# Patient Record
Sex: Male | Born: 1945 | Race: White | Hispanic: No | Marital: Married | Smoking: Light tobacco smoker
Health system: Southern US, Community
[De-identification: ages and names within clinical notes are randomized; demographics above are authoritative.]

## PROBLEM LIST (undated history)

## (undated) DIAGNOSIS — K219 Gastro-esophageal reflux disease without esophagitis: Secondary | ICD-10-CM

## (undated) DIAGNOSIS — I639 Cerebral infarction, unspecified: Secondary | ICD-10-CM

## (undated) DIAGNOSIS — C801 Malignant (primary) neoplasm, unspecified: Secondary | ICD-10-CM

## (undated) DIAGNOSIS — J45909 Unspecified asthma, uncomplicated: Secondary | ICD-10-CM

## (undated) DIAGNOSIS — R42 Dizziness and giddiness: Secondary | ICD-10-CM

## (undated) DIAGNOSIS — H9190 Unspecified hearing loss, unspecified ear: Secondary | ICD-10-CM

## (undated) HISTORY — PX: JOINT REPLACEMENT: SHX530

## (undated) HISTORY — PX: EYE SURGERY: SHX253

## (undated) HISTORY — PX: LACERATION REPAIR: SHX5168

---

## 1998-04-04 ENCOUNTER — Other Ambulatory Visit: Admission: RE | Admit: 1998-04-04 | Discharge: 1998-04-04 | Payer: Self-pay

## 1999-03-06 ENCOUNTER — Ambulatory Visit (HOSPITAL_COMMUNITY): Admission: RE | Admit: 1999-03-06 | Discharge: 1999-03-06 | Payer: Self-pay | Admitting: Gastroenterology

## 2004-06-23 ENCOUNTER — Ambulatory Visit (HOSPITAL_COMMUNITY): Admission: RE | Admit: 2004-06-23 | Discharge: 2004-06-23 | Payer: Self-pay | Admitting: *Deleted

## 2014-07-29 ENCOUNTER — Ambulatory Visit: Payer: Self-pay | Admitting: Gastroenterology

## 2014-07-31 LAB — PATHOLOGY REPORT

## 2016-07-05 DIAGNOSIS — M25561 Pain in right knee: Secondary | ICD-10-CM | POA: Diagnosis not present

## 2016-07-05 DIAGNOSIS — M1711 Unilateral primary osteoarthritis, right knee: Secondary | ICD-10-CM | POA: Diagnosis not present

## 2016-07-23 DIAGNOSIS — M1711 Unilateral primary osteoarthritis, right knee: Secondary | ICD-10-CM | POA: Diagnosis not present

## 2016-08-04 ENCOUNTER — Ambulatory Visit
Admission: RE | Admit: 2016-08-04 | Discharge: 2016-08-04 | Disposition: A | Discharge status: Home / Self Care | Payer: PPO | Source: Ambulatory Visit | Attending: Orthopedic Surgery | Admitting: Orthopedic Surgery

## 2016-08-04 DIAGNOSIS — Z01810 Encounter for preprocedural cardiovascular examination: Secondary | ICD-10-CM | POA: Insufficient documentation

## 2016-08-04 DIAGNOSIS — Z01812 Encounter for preprocedural laboratory examination: Secondary | ICD-10-CM | POA: Diagnosis not present

## 2016-08-04 HISTORY — DX: Unspecified asthma, uncomplicated: J45.909

## 2016-08-04 HISTORY — DX: Gastro-esophageal reflux disease without esophagitis: K21.9

## 2016-08-04 LAB — COMPREHENSIVE METABOLIC PANEL
ALT: 19 U/L (ref 17–63)
ANION GAP: 7 (ref 5–15)
AST: 22 U/L (ref 15–41)
Albumin: 4.8 g/dL (ref 3.5–5.0)
Alkaline Phosphatase: 56 U/L (ref 38–126)
BUN: 13 mg/dL (ref 6–20)
CHLORIDE: 104 mmol/L (ref 101–111)
CO2: 25 mmol/L (ref 22–32)
Calcium: 9.2 mg/dL (ref 8.9–10.3)
Creatinine, Ser: 0.89 mg/dL (ref 0.61–1.24)
Glucose, Bld: 131 mg/dL — ABNORMAL HIGH (ref 65–99)
POTASSIUM: 3.9 mmol/L (ref 3.5–5.1)
Sodium: 136 mmol/L (ref 135–145)
TOTAL PROTEIN: 7.8 g/dL (ref 6.5–8.1)
Total Bilirubin: 0.4 mg/dL (ref 0.3–1.2)

## 2016-08-04 LAB — SURGICAL PCR SCREEN
MRSA, PCR: NEGATIVE
Staphylococcus aureus: NEGATIVE

## 2016-08-04 LAB — CBC
HCT: 42.9 % (ref 40.0–52.0)
Hemoglobin: 15 g/dL (ref 13.0–18.0)
MCH: 30.3 pg (ref 26.0–34.0)
MCHC: 34.8 g/dL (ref 32.0–36.0)
MCV: 87.1 fL (ref 80.0–100.0)
PLATELETS: 226 10*3/uL (ref 150–440)
RBC: 4.93 MIL/uL (ref 4.40–5.90)
RDW: 13.7 % (ref 11.5–14.5)
WBC: 8.3 10*3/uL (ref 3.8–10.6)

## 2016-08-04 LAB — TYPE AND SCREEN
ABO/RH(D): B POS
ANTIBODY SCREEN: NEGATIVE

## 2016-08-04 LAB — URINALYSIS COMPLETE WITH MICROSCOPIC (ARMC ONLY)
BACTERIA UA: NONE SEEN
Bilirubin Urine: NEGATIVE
GLUCOSE, UA: NEGATIVE mg/dL
HGB URINE DIPSTICK: NEGATIVE
Leukocytes, UA: NEGATIVE
Nitrite: NEGATIVE
PROTEIN: NEGATIVE mg/dL
Specific Gravity, Urine: 1.014 (ref 1.005–1.030)
pH: 6 (ref 5.0–8.0)

## 2016-08-04 LAB — PROTIME-INR
INR: 1.02
PROTHROMBIN TIME: 13.4 s (ref 11.4–15.2)

## 2016-08-04 LAB — SEDIMENTATION RATE: SED RATE: 8 mm/h (ref 0–20)

## 2016-08-04 LAB — APTT: aPTT: 36 seconds (ref 24–36)

## 2016-08-04 NOTE — Patient Instructions (Signed)
  Your procedure is scheduled on: 08/18/16 Wed Report to Same Day Surgery 2nd floor medical mall To find out your arrival time please call [phone] between 1PM - 3PM on 08/17/16 Tues  Remember: Instructions that are not followed completely may result in serious medical risk, up to and including death, or upon the discretion of your surgeon and anesthesiologist your surgery may need to be rescheduled.    _x___ 1. Do not eat food or drink liquids after midnight. No gum chewing or hard candies.     ____ 2. No Alcohol for 24 hours before or after surgery.   ____3. No Smoking for 24 prior to surgery.   ____  4. Bring all medications with you on the day of surgery if instructed.    __x__ 5. Notify your doctor if there is any change in your medical condition     (cold, fever, infections).     Do not wear jewelry, make-up, hairpins, clips or nail polish.  Do not wear lotions, powders, or perfumes. You may wear deodorant.  Do not shave 48 hours prior to surgery. Men may shave face and neck.  Do not bring valuables to the hospital.    [facility] is not responsible for any belongings or valuables.               Contacts, dentures or bridgework may not be worn into surgery.  Leave your suitcase in the car. After surgery it may be brought to your room.  For patients admitted to the hospital, discharge time is determined by your treatment team.   Patients discharged the day of surgery will not be allowed to drive home.    Please read over the following fact sheets that you were given:   [facility] Preparing for Surgery and or MRSA Information   _x___ Take these medicines the morning of surgery with A SIP OF WATER:    1. None  2.  3.  4.  5.  6.  ____ Fleet Enema (as directed)   _x___ Use CHG Soap or sage wipes as directed on instruction sheet   ____ Use inhalers on the day of surgery and bring to hospital day of surgery  ____ Stop metformin 2 days prior to surgery    ____  Take 1/2 of usual insulin dose the night before surgery and none on the morning of           surgery.   _x___ Stop aspirin or coumadin, or plavix stop aspirin 1 week before surgery  _x__ Stop Anti-inflammatories such as Advil, Aleve, Ibuprofen, Motrin, Naproxen,          Naprosyn, Goodies powders or aspirin products. Ok to take Tylenol.   _x___ Stop supplements until after surgery.  Stop fish oil 1 week before surgery  ____ Bring C-Pap to the hospital.

## 2016-08-05 LAB — URINE CULTURE
Culture: NO GROWTH
SPECIAL REQUESTS: NORMAL

## 2016-08-18 ENCOUNTER — Inpatient Hospital Stay
Admission: RE | Admit: 2016-08-18 | Discharge: 2016-08-20 | DRG: 470 | Disposition: A | Discharge status: Home Health | Payer: PPO | Source: Ambulatory Visit | Attending: Orthopedic Surgery | Admitting: Orthopedic Surgery

## 2016-08-18 ENCOUNTER — Inpatient Hospital Stay: Payer: PPO | Admitting: Anesthesiology

## 2016-08-18 ENCOUNTER — Inpatient Hospital Stay: Payer: PPO

## 2016-08-18 ENCOUNTER — Encounter
Admission: RE | Disposition: A | Discharge status: Home Health | Payer: Self-pay | Source: Ambulatory Visit | Attending: Orthopedic Surgery

## 2016-08-18 ENCOUNTER — Encounter: Payer: Self-pay | Admitting: Orthopedic Surgery

## 2016-08-18 DIAGNOSIS — Z87891 Personal history of nicotine dependence: Secondary | ICD-10-CM

## 2016-08-18 DIAGNOSIS — M6281 Muscle weakness (generalized): Secondary | ICD-10-CM

## 2016-08-18 DIAGNOSIS — Z79899 Other long term (current) drug therapy: Secondary | ICD-10-CM

## 2016-08-18 DIAGNOSIS — Z96651 Presence of right artificial knee joint: Secondary | ICD-10-CM | POA: Diagnosis not present

## 2016-08-18 DIAGNOSIS — M1711 Unilateral primary osteoarthritis, right knee: Principal | ICD-10-CM | POA: Diagnosis present

## 2016-08-18 DIAGNOSIS — K219 Gastro-esophageal reflux disease without esophagitis: Secondary | ICD-10-CM | POA: Diagnosis not present

## 2016-08-18 DIAGNOSIS — Z471 Aftercare following joint replacement surgery: Secondary | ICD-10-CM | POA: Diagnosis not present

## 2016-08-18 DIAGNOSIS — M25561 Pain in right knee: Secondary | ICD-10-CM

## 2016-08-18 DIAGNOSIS — R262 Difficulty in walking, not elsewhere classified: Secondary | ICD-10-CM

## 2016-08-18 DIAGNOSIS — Z96659 Presence of unspecified artificial knee joint: Secondary | ICD-10-CM

## 2016-08-18 DIAGNOSIS — J45909 Unspecified asthma, uncomplicated: Secondary | ICD-10-CM | POA: Diagnosis present

## 2016-08-18 HISTORY — PX: KNEE ARTHROPLASTY: SHX992

## 2016-08-18 LAB — ABO/RH: ABO/RH(D): B POS

## 2016-08-18 SURGERY — ARTHROPLASTY, KNEE, TOTAL, USING IMAGELESS COMPUTER-ASSISTED NAVIGATION
Anesthesia: Spinal | Site: Knee | Laterality: Right | Wound class: Clean

## 2016-08-18 MED ORDER — TRANEXAMIC ACID 1000 MG/10ML IV SOLN
1000.0000 mg | Freq: Once | INTRAVENOUS | Status: AC
  Administered 2016-08-18: 1000 mg via INTRAVENOUS
  Filled 2016-08-18: qty 10

## 2016-08-18 MED ORDER — SODIUM CHLORIDE 0.9 % IV SOLN
INTRAVENOUS | Status: DC | PRN
  Administered 2016-08-18: 60 mL

## 2016-08-18 MED ORDER — ACETAMINOPHEN 10 MG/ML IV SOLN
INTRAVENOUS | Status: AC
  Filled 2016-08-18: qty 100

## 2016-08-18 MED ORDER — MENTHOL 3 MG MT LOZG
1.0000 | LOZENGE | OROMUCOSAL | Status: DC | PRN
  Filled 2016-08-18: qty 9

## 2016-08-18 MED ORDER — NEOMYCIN-POLYMYXIN B GU 40-200000 IR SOLN
Status: DC | PRN
  Administered 2016-08-18: 14 mL

## 2016-08-18 MED ORDER — ALUM & MAG HYDROXIDE-SIMETH 200-200-20 MG/5ML PO SUSP: 30.0000 mL | ORAL | Status: DC | PRN

## 2016-08-18 MED ORDER — CALCIUM CARBONATE ANTACID 500 MG PO CHEW
500.0000 mg | CHEWABLE_TABLET | Freq: Every day | ORAL | Status: DC
  Administered 2016-08-19 – 2016-08-20 (×2): 500 mg via ORAL
  Filled 2016-08-18 (×2): qty 1

## 2016-08-18 MED ORDER — OMEGA-3-ACID ETHYL ESTERS 1 G PO CAPS
1.0000 g | ORAL_CAPSULE | Freq: Every day | ORAL | Status: DC
  Administered 2016-08-18 – 2016-08-20 (×3): 1 g via ORAL
  Filled 2016-08-18 (×3): qty 1

## 2016-08-18 MED ORDER — BUPIVACAINE HCL (PF) 0.5 % IJ SOLN
INTRAMUSCULAR | Status: DC | PRN
  Administered 2016-08-18: 3 mL

## 2016-08-18 MED ORDER — SALINE SPRAY 0.65 % NA SOLN
1.0000 | NASAL | Status: DC | PRN
  Filled 2016-08-18: qty 44

## 2016-08-18 MED ORDER — CEFAZOLIN SODIUM-DEXTROSE 2-4 GM/100ML-% IV SOLN
2.0000 g | Freq: Four times a day (QID) | INTRAVENOUS | Status: AC
  Administered 2016-08-18 – 2016-08-19 (×4): 2 g via INTRAVENOUS
  Filled 2016-08-18 (×5): qty 100

## 2016-08-18 MED ORDER — METOCLOPRAMIDE HCL 10 MG PO TABS
10.0000 mg | ORAL_TABLET | Freq: Three times a day (TID) | ORAL | Status: DC
  Administered 2016-08-18 – 2016-08-20 (×7): 10 mg via ORAL
  Filled 2016-08-18 (×7): qty 1

## 2016-08-18 MED ORDER — FENTANYL CITRATE (PF) 100 MCG/2ML IJ SOLN
INTRAMUSCULAR | Status: AC
  Filled 2016-08-18: qty 2

## 2016-08-18 MED ORDER — CALCIUM CARBONATE ANTACID 500 MG PO CHEW: 1.0000 | CHEWABLE_TABLET | Freq: Two times a day (BID) | ORAL | Status: DC | PRN

## 2016-08-18 MED ORDER — DIPHENHYDRAMINE HCL 12.5 MG/5ML PO ELIX: 12.5000 mg | ORAL_SOLUTION | ORAL | Status: DC | PRN

## 2016-08-18 MED ORDER — BISACODYL 10 MG RE SUPP
10.0000 mg | Freq: Every day | RECTAL | Status: DC | PRN
  Filled 2016-08-18: qty 1

## 2016-08-18 MED ORDER — FLEET ENEMA 7-19 GM/118ML RE ENEM: 1.0000 | ENEMA | Freq: Once | RECTAL | Status: DC | PRN

## 2016-08-18 MED ORDER — ONDANSETRON HCL 4 MG/2ML IJ SOLN: 4.0000 mg | Freq: Once | INTRAMUSCULAR | Status: DC | PRN

## 2016-08-18 MED ORDER — ACETAMINOPHEN 10 MG/ML IV SOLN
1000.0000 mg | Freq: Four times a day (QID) | INTRAVENOUS | Status: AC
  Administered 2016-08-18 – 2016-08-19 (×4): 1000 mg via INTRAVENOUS
  Filled 2016-08-18 (×4): qty 100

## 2016-08-18 MED ORDER — FENTANYL CITRATE (PF) 100 MCG/2ML IJ SOLN
25.0000 ug | INTRAMUSCULAR | Status: DC | PRN
  Administered 2016-08-18 (×4): 25 ug via INTRAVENOUS

## 2016-08-18 MED ORDER — ACETAMINOPHEN 325 MG PO TABS: 650.0000 mg | ORAL_TABLET | Freq: Four times a day (QID) | ORAL | Status: DC | PRN

## 2016-08-18 MED ORDER — SODIUM CHLORIDE FLUSH 0.9 % IV SOLN
INTRAVENOUS | Status: AC
  Filled 2016-08-18: qty 10

## 2016-08-18 MED ORDER — CEFAZOLIN SODIUM-DEXTROSE 2-4 GM/100ML-% IV SOLN: 2.0000 g | INTRAVENOUS | Status: DC

## 2016-08-18 MED ORDER — CELECOXIB 200 MG PO CAPS
200.0000 mg | ORAL_CAPSULE | Freq: Two times a day (BID) | ORAL | Status: DC
  Administered 2016-08-18 – 2016-08-20 (×4): 200 mg via ORAL
  Filled 2016-08-18 (×4): qty 1

## 2016-08-18 MED ORDER — FERROUS SULFATE 325 (65 FE) MG PO TABS
325.0000 mg | ORAL_TABLET | Freq: Two times a day (BID) | ORAL | Status: DC
  Administered 2016-08-18 – 2016-08-20 (×4): 325 mg via ORAL
  Filled 2016-08-18 (×4): qty 1

## 2016-08-18 MED ORDER — MAGNESIUM HYDROXIDE 400 MG/5ML PO SUSP
30.0000 mL | Freq: Every day | ORAL | Status: DC | PRN
  Filled 2016-08-18: qty 30

## 2016-08-18 MED ORDER — SODIUM CHLORIDE 0.9 % IV SOLN
Freq: Once | INTRAVENOUS | Status: AC
  Administered 2016-08-18: 19:00:00 via INTRAVENOUS

## 2016-08-18 MED ORDER — CHLORHEXIDINE GLUCONATE 4 % EX LIQD: 60.0000 mL | Freq: Once | CUTANEOUS | Status: DC

## 2016-08-18 MED ORDER — SENNOSIDES-DOCUSATE SODIUM 8.6-50 MG PO TABS
1.0000 | ORAL_TABLET | Freq: Two times a day (BID) | ORAL | Status: DC
  Administered 2016-08-18 – 2016-08-20 (×4): 1 via ORAL
  Filled 2016-08-18 (×4): qty 1

## 2016-08-18 MED ORDER — MIDAZOLAM HCL 5 MG/5ML IJ SOLN
INTRAMUSCULAR | Status: DC | PRN
  Administered 2016-08-18: 2 mg via INTRAVENOUS

## 2016-08-18 MED ORDER — BUPIVACAINE HCL (PF) 0.25 % IJ SOLN
INTRAMUSCULAR | Status: AC
  Filled 2016-08-18: qty 60

## 2016-08-18 MED ORDER — SODIUM CHLORIDE 0.9 % IJ SOLN
INTRAMUSCULAR | Status: AC
  Filled 2016-08-18: qty 50

## 2016-08-18 MED ORDER — LACTATED RINGERS IV SOLN
INTRAVENOUS | Status: DC
  Administered 2016-08-18 (×2): via INTRAVENOUS

## 2016-08-18 MED ORDER — ACETAMINOPHEN 650 MG RE SUPP: 650.0000 mg | Freq: Four times a day (QID) | RECTAL | Status: DC | PRN

## 2016-08-18 MED ORDER — PHENOL 1.4 % MT LIQD
1.0000 | OROMUCOSAL | Status: DC | PRN
Start: 2016-08-18 — End: 2016-08-20
  Filled 2016-08-18: qty 177

## 2016-08-18 MED ORDER — MORPHINE SULFATE (PF) 2 MG/ML IV SOLN: 2.0000 mg | INTRAVENOUS | Status: DC | PRN

## 2016-08-18 MED ORDER — PROPOFOL 500 MG/50ML IV EMUL
INTRAVENOUS | Status: DC | PRN
  Administered 2016-08-18: 120 ug/kg/min via INTRAVENOUS

## 2016-08-18 MED ORDER — BUPIVACAINE LIPOSOME 1.3 % IJ SUSP
INTRAMUSCULAR | Status: AC
  Filled 2016-08-18: qty 20

## 2016-08-18 MED ORDER — CEFAZOLIN SODIUM-DEXTROSE 2-3 GM-% IV SOLR
INTRAVENOUS | Status: DC | PRN
  Administered 2016-08-18: 2 g via INTRAVENOUS

## 2016-08-18 MED ORDER — ONDANSETRON HCL 4 MG/2ML IJ SOLN: 4.0000 mg | Freq: Four times a day (QID) | INTRAMUSCULAR | Status: DC | PRN

## 2016-08-18 MED ORDER — CEFAZOLIN SODIUM-DEXTROSE 2-4 GM/100ML-% IV SOLN
INTRAVENOUS | Status: AC
  Filled 2016-08-18: qty 100

## 2016-08-18 MED ORDER — ENOXAPARIN SODIUM 30 MG/0.3ML ~~LOC~~ SOLN
30.0000 mg | Freq: Two times a day (BID) | SUBCUTANEOUS | Status: DC
  Administered 2016-08-19 – 2016-08-20 (×3): 30 mg via SUBCUTANEOUS
  Filled 2016-08-18 (×3): qty 0.3

## 2016-08-18 MED ORDER — ONDANSETRON HCL 4 MG PO TABS: 4.0000 mg | ORAL_TABLET | Freq: Four times a day (QID) | ORAL | Status: DC | PRN

## 2016-08-18 MED ORDER — SODIUM CHLORIDE 0.9 % IV SOLN
INTRAVENOUS | Status: DC
  Administered 2016-08-18: 1000 mL via INTRAVENOUS

## 2016-08-18 MED ORDER — NEOMYCIN-POLYMYXIN B GU 40-200000 IR SOLN
Status: AC
  Filled 2016-08-18: qty 20

## 2016-08-18 MED ORDER — FAMOTIDINE 20 MG PO TABS
ORAL_TABLET | ORAL | Status: AC
  Administered 2016-08-18: 20 mg
  Filled 2016-08-18: qty 1

## 2016-08-18 MED ORDER — ACETAMINOPHEN 10 MG/ML IV SOLN
INTRAVENOUS | Status: DC | PRN
  Administered 2016-08-18: 1000 mg via INTRAVENOUS

## 2016-08-18 MED ORDER — PANTOPRAZOLE SODIUM 40 MG PO TBEC
40.0000 mg | DELAYED_RELEASE_TABLET | Freq: Two times a day (BID) | ORAL | Status: DC
  Administered 2016-08-18 – 2016-08-20 (×4): 40 mg via ORAL
  Filled 2016-08-18 (×4): qty 1

## 2016-08-18 MED ORDER — BUPIVACAINE HCL (PF) 0.25 % IJ SOLN
INTRAMUSCULAR | Status: DC | PRN
  Administered 2016-08-18: 60 mL

## 2016-08-18 MED ORDER — TRANEXAMIC ACID 1000 MG/10ML IV SOLN
1000.0000 mg | INTRAVENOUS | Status: DC
  Filled 2016-08-18: qty 10

## 2016-08-18 MED ORDER — BUPIVACAINE HCL (PF) 0.5 % IJ SOLN
INTRAMUSCULAR | Status: AC
  Filled 2016-08-18: qty 30

## 2016-08-18 MED ORDER — TRAMADOL HCL 50 MG PO TABS
50.0000 mg | ORAL_TABLET | ORAL | Status: DC | PRN
  Administered 2016-08-18 – 2016-08-19 (×3): 50 mg via ORAL
  Administered 2016-08-19: 100 mg via ORAL
  Administered 2016-08-20: 50 mg via ORAL
  Filled 2016-08-18: qty 2
  Filled 2016-08-18 (×4): qty 1

## 2016-08-18 MED ORDER — ADULT MULTIVITAMIN W/MINERALS CH
1.0000 | ORAL_TABLET | Freq: Every day | ORAL | Status: DC
  Administered 2016-08-18 – 2016-08-20 (×3): 1 via ORAL
  Filled 2016-08-18 (×3): qty 1

## 2016-08-18 MED ORDER — OXYCODONE HCL 5 MG PO TABS
5.0000 mg | ORAL_TABLET | ORAL | Status: DC | PRN
  Administered 2016-08-18 – 2016-08-19 (×4): 10 mg via ORAL
  Administered 2016-08-19: 5 mg via ORAL
  Administered 2016-08-20 (×2): 10 mg via ORAL
  Filled 2016-08-18 (×3): qty 1
  Filled 2016-08-18 (×5): qty 2

## 2016-08-18 MED ORDER — FAMOTIDINE 20 MG PO TABS
20.0000 mg | ORAL_TABLET | Freq: Once | ORAL | Status: AC
  Administered 2016-08-18: 20 mg via ORAL
  Filled 2016-08-18: qty 1

## 2016-08-18 MED ORDER — DIPHENHYDRAMINE HCL 25 MG PO TABS
25.0000 mg | ORAL_TABLET | Freq: Four times a day (QID) | ORAL | Status: DC | PRN
  Filled 2016-08-18: qty 1

## 2016-08-18 SURGICAL SUPPLY — 58 items
AUTOTRANSFUS HAS 1/8 (MISCELLANEOUS) ×3
BATTERY INSTRU NAVIGATION (MISCELLANEOUS) ×12 IMPLANT
BLADE SAW 1 (BLADE) ×3 IMPLANT
BLADE SAW 1/2 (BLADE) ×3 IMPLANT
CANISTER SUCT 1200ML W/VALVE (MISCELLANEOUS) ×3 IMPLANT
CANISTER SUCT 3000ML (MISCELLANEOUS) ×6 IMPLANT
CAPT KNEE TOTAL 3 ATTUNE ×3 IMPLANT
CATH TRAY METER 16FR LF (MISCELLANEOUS) ×3 IMPLANT
CEMENT HV SMART SET (Cement) ×6 IMPLANT
COOLER POLAR GLACIER W/PUMP (MISCELLANEOUS) ×3 IMPLANT
CUFF TOURN 24 STER (MISCELLANEOUS) IMPLANT
CUFF TOURN 30 STER DUAL PORT (MISCELLANEOUS) ×3 IMPLANT
DRAPE SHEET LG 3/4 BI-LAMINATE (DRAPES) ×3 IMPLANT
DRSG DERMACEA 8X12 NADH (GAUZE/BANDAGES/DRESSINGS) ×3 IMPLANT
DRSG OPSITE POSTOP 4X14 (GAUZE/BANDAGES/DRESSINGS) ×3 IMPLANT
DRSG TEGADERM 4X4.75 (GAUZE/BANDAGES/DRESSINGS) ×3 IMPLANT
DURAPREP 26ML APPLICATOR (WOUND CARE) ×6 IMPLANT
ELECT CAUTERY BLADE 6.4 (BLADE) ×3 IMPLANT
ELECT REM PT RETURN 9FT ADLT (ELECTROSURGICAL) ×3
ELECTRODE REM PT RTRN 9FT ADLT (ELECTROSURGICAL) ×1 IMPLANT
EX-PIN ORTHOLOCK NAV 4X150 (PIN) ×6 IMPLANT
GLOVE BIOGEL M STRL SZ7.5 (GLOVE) ×6 IMPLANT
GLOVE INDICATOR 8.0 STRL GRN (GLOVE) ×3 IMPLANT
GLOVE SURG 9.0 ORTHO LTXF (GLOVE) ×3 IMPLANT
GLOVE SURG ORTHO 9.0 STRL STRW (GLOVE) ×3 IMPLANT
GOWN STRL REUS W/ TWL LRG LVL3 (GOWN DISPOSABLE) ×2 IMPLANT
GOWN STRL REUS W/TWL 2XL LVL3 (GOWN DISPOSABLE) ×3 IMPLANT
GOWN STRL REUS W/TWL LRG LVL3 (GOWN DISPOSABLE) ×4
HANDPIECE INTERPULSE COAX TIP (DISPOSABLE) ×2
HOLDER FOLEY CATH W/STRAP (MISCELLANEOUS) ×3 IMPLANT
HOOD PEEL AWAY FLYTE STAYCOOL (MISCELLANEOUS) ×6 IMPLANT
KIT RM TURNOVER STRD PROC AR (KITS) ×3 IMPLANT
KNIFE SCULPS 14X20 (INSTRUMENTS) ×3 IMPLANT
LABEL OR SOLS (LABEL) ×3 IMPLANT
NDL SAFETY 18GX1.5 (NEEDLE) ×3 IMPLANT
NEEDLE SPNL 20GX3.5 QUINCKE YW (NEEDLE) ×3 IMPLANT
NS IRRIG 500ML POUR BTL (IV SOLUTION) ×3 IMPLANT
PACK TOTAL KNEE (MISCELLANEOUS) ×3 IMPLANT
PAD WRAPON POLAR KNEE (MISCELLANEOUS) ×1 IMPLANT
PIN FIXATION 1/8DIA X 3INL (PIN) ×3 IMPLANT
SET HNDPC FAN SPRY TIP SCT (DISPOSABLE) ×1 IMPLANT
SOL .9 NS 3000ML IRR  AL (IV SOLUTION) ×2
SOL .9 NS 3000ML IRR UROMATIC (IV SOLUTION) ×1 IMPLANT
SOL PREP PVP 2OZ (MISCELLANEOUS) ×3
SOLUTION PREP PVP 2OZ (MISCELLANEOUS) ×1 IMPLANT
SPONGE DRAIN TRACH 4X4 STRL 2S (GAUZE/BANDAGES/DRESSINGS) ×3 IMPLANT
STAPLER SKIN PROX 35W (STAPLE) ×3 IMPLANT
SUCTION FRAZIER HANDLE 10FR (MISCELLANEOUS) ×2
SUCTION TUBE FRAZIER 10FR DISP (MISCELLANEOUS) ×1 IMPLANT
SUT VIC AB 0 CT1 36 (SUTURE) ×3 IMPLANT
SUT VIC AB 1 CT1 36 (SUTURE) ×6 IMPLANT
SUT VIC AB 2-0 CT2 27 (SUTURE) ×3 IMPLANT
SYR 20CC LL (SYRINGE) ×3 IMPLANT
SYR 30ML LL (SYRINGE) ×6 IMPLANT
SYSTEM AUTOTRANSFUS DUAL TROCR (MISCELLANEOUS) ×1 IMPLANT
TOWEL OR 17X26 4PK STRL BLUE (TOWEL DISPOSABLE) ×3 IMPLANT
TOWER CARTRIDGE SMART MIX (DISPOSABLE) ×3 IMPLANT
WRAPON POLAR PAD KNEE (MISCELLANEOUS) ×3

## 2016-08-18 NOTE — Anesthesia Procedure Notes (Signed)
Spinal  Patient location during procedure: OR End time: 08/18/2016 11:30 AM Staffing Anesthesiologist: Gunnar Fusi Resident/CRNA: Jonna Clark Performed: resident/CRNA  Preanesthetic Checklist Completed: patient identified, site marked, surgical consent, pre-op evaluation, timeout performed, IV checked, risks and benefits discussed and monitors and equipment checked Spinal Block Patient position: sitting Prep: Betadine Patient monitoring: heart rate, continuous pulse ox and blood pressure Approach: midline Location: L3-4 Injection technique: single-shot Needle Needle type: Whitacre  Needle gauge: 24 G Needle length: 9 cm Assessment Sensory level: T6

## 2016-08-18 NOTE — Progress Notes (Signed)
Autologous transfusion begun at 1830, pt tolerating well.

## 2016-08-18 NOTE — Anesthesia Preprocedure Evaluation (Deleted)
Anesthesia Evaluation    Airway        Dental   Pulmonary           Cardiovascular      Neuro/Psych    GI/Hepatic GERD  Medicated,  Endo/Other    Renal/GU      Musculoskeletal   Abdominal   Peds  Hematology   Anesthesia Other Findings   Reproductive/Obstetrics                             Anesthesia Physical Anesthesia Plan Anesthesia Quick Evaluation

## 2016-08-18 NOTE — Brief Op Note (Signed)
08/18/2016  3:25 PM  PATIENT:  Seth Holt  70 y.o. male  PRE-OPERATIVE DIAGNOSIS:  primary osteoarthritis of the right knee  POST-OPERATIVE DIAGNOSIS:  Same  PROCEDURE:  Procedure(s): COMPUTER ASSISTED TOTAL KNEE ARTHROPLASTY (Right)  SURGEON:  Surgeon(s) and Role:    * Dereck Leep, MD - Primary  ASSISTANTS: Vance Peper, PA   ANESTHESIA:   spinal  EBL:  Total I/O In: 1000 [I.V.:1000] Out: 550 [Urine:500; Blood:50]  BLOOD ADMINISTERED:none  DRAINS: 2 medium drains to a reinfusion system   LOCAL MEDICATIONS USED:  MARCAINE    and OTHER Exparel  SPECIMEN:  No Specimen  DISPOSITION OF SPECIMEN:  N/A  COUNTS:  YES  TOURNIQUET:   101 minutes  DICTATION: .Dragon Dictation  PLAN OF CARE: Admit to inpatient   PATIENT DISPOSITION:  PACU - hemodynamically stable.   Delay start of Pharmacological VTE agent (>24hrs) due to surgical blood loss or risk of bleeding: yes

## 2016-08-18 NOTE — Anesthesia Preprocedure Evaluation (Signed)
Anesthesia Evaluation  Patient identified by MRN, date of birth, ID band Patient awake    Reviewed: Allergy & Precautions, NPO status , Patient's Chart, lab work & pertinent test results  History of Anesthesia Complications Negative for: history of anesthetic complications  Airway Mallampati: II       Dental   Pulmonary asthma (allergic) ,           Cardiovascular negative cardio ROS       Neuro/Psych negative neurological ROS     GI/Hepatic Neg liver ROS, GERD  Medicated and Poorly Controlled,  Endo/Other  negative endocrine ROS  Renal/GU negative Renal ROS     Musculoskeletal   Abdominal   Peds  Hematology negative hematology ROS (+)   Anesthesia Other Findings   Reproductive/Obstetrics                             Anesthesia Physical Anesthesia Plan  ASA: II  Anesthesia Plan: Spinal   Post-op Pain Management:    Induction:   Airway Management Planned:   Additional Equipment:   Intra-op Plan:   Post-operative Plan:   Informed Consent: I have reviewed the patients History and Physical, chart, labs and discussed the procedure including the risks, benefits and alternatives for the proposed anesthesia with the patient or authorized representative who has indicated his/her understanding and acceptance.     Plan Discussed with:   Anesthesia Plan Comments:         Anesthesia Quick Evaluation

## 2016-08-18 NOTE — H&P (Signed)
The patient has been re-examined, and the chart reviewed, and there have been no interval changes to the documented history and physical.    The risks, benefits, and alternatives have been discussed at length. The patient expressed understanding of the risks benefits and agreed with plans for surgical intervention.  James P. Hooten, Jr. M.D.    

## 2016-08-18 NOTE — Transfer of Care (Signed)
Immediate Anesthesia Transfer of Care Note  Patient: Seth Holt  Procedure(s) Performed: Procedure(s): COMPUTER ASSISTED TOTAL KNEE ARTHROPLASTY (Right)  Patient Location: PACU  Anesthesia Type:Spinal  Level of Consciousness: awake, alert  and oriented  Airway & Oxygen Therapy: Patient Spontanous Breathing  Post-op Assessment: Report given to RN and Post -op Vital signs reviewed and stable  Post vital signs: Reviewed and stable  Last Vitals:  Vitals:   08/18/16 1015 08/18/16 1524  BP: (!) 146/85 (!) 102/59  Pulse: 96 63  Resp: 16 (!) 22  Temp: (!) 36 C 36.3 C    Last Pain:  Vitals:   08/18/16 1015  TempSrc: Tympanic         Complications: No apparent anesthesia complications

## 2016-08-18 NOTE — Op Note (Signed)
OPERATIVE NOTE  DATE OF SURGERY:  08/18/2016  PATIENT NAME:  Seth Holt   DOB: [birth year]  MRN: [id number]  PRE-OPERATIVE DIAGNOSIS: Degenerative arthrosis of the right knee, primary  POST-OPERATIVE DIAGNOSIS:  Same  PROCEDURE:  Right total knee arthroplasty using computer-assisted navigation  SURGEON:  Marciano Sequin. M.D.  ASSISTANT:  Vance Peper, PA (present and scrubbed throughout the case, critical for assistance with exposure, retraction, instrumentation, and closure)  ANESTHESIA: spinal  ESTIMATED BLOOD LOSS: 50 mL  FLUIDS REPLACED: 1150 mL of crystalloid  TOURNIQUET TIME: 101 minutes  DRAINS: 2 medium drains to a reinfusion system  SOFT TISSUE RELEASES: Anterior cruciate ligament, posterior cruciate ligament, deep medial collateral ligament, patellofemoral ligament  IMPLANTS UTILIZED: DePuy Attune size 7 posterior stabilized femoral component (cemented), size 7 rotating platform tibial component (cemented), 38 mm medialized dome patella (cemented), and a 5 mm stabilized rotating platform polyethylene insert.  INDICATIONS FOR SURGERY: Seth Holt is a 70 y.o. year old male with a long history of progressive knee pain. X-rays demonstrated severe degenerative changes in tricompartmental fashion. The patient had not seen any significant improvement despite conservative nonsurgical intervention. After discussion of the risks and benefits of surgical intervention, the patient expressed understanding of the risks benefits and agree with plans for total knee arthroplasty.   The risks, benefits, and alternatives were discussed at length including but not limited to the risks of infection, bleeding, nerve injury, stiffness, blood clots, the need for revision surgery, cardiopulmonary complications, among others, and they were willing to proceed.  PROCEDURE IN DETAIL: The patient was brought into the operating room and, after adequate spinal anesthesia was achieved, a  tourniquet was placed on the patient's upper thigh. The patient's knee and leg were cleaned and prepped with alcohol and DuraPrep and draped in the usual sterile fashion. A "timeout" was performed as per usual protocol. The lower extremity was exsanguinated using an Esmarch, and the tourniquet was inflated to 300 mmHg. An anterior longitudinal incision was made followed by a standard mid vastus approach. The deep fibers of the medial collateral ligament were elevated in a subperiosteal fashion off of the medial flare of the tibia so as to maintain a continuous soft tissue sleeve. The patella was subluxed laterally and the patellofemoral ligament was incised. Inspection of the knee demonstrated severe degenerative changes with full-thickness loss of articular cartilage. Osteophytes were debrided using a rongeur. Anterior and posterior cruciate ligaments were excised. Two 4.0 mm Schanz pins were inserted in the femur and into the tibia for attachment of the array of trackers used for computer-assisted navigation. Hip center was identified using a circumduction technique. Distal landmarks were mapped using the computer. The distal femur and proximal tibia were mapped using the computer. The distal femoral cutting guide was positioned using computer-assisted navigation so as to achieve a 5 distal valgus cut. The femur was sized and it was felt that a size 7 femoral component was appropriate. A size 7 femoral cutting guide was positioned and the anterior cut was performed and verified using the computer. This was followed by completion of the posterior and chamfer cuts. Femoral cutting guide for the central box was then positioned in the center box cut was performed.  Attention was then directed to the proximal tibia. Medial and lateral menisci were excised. The extramedullary tibial cutting guide was positioned using computer-assisted navigation so as to achieve a 0 varus-valgus alignment and 3 posterior slope. The  cut was performed and verified using the computer.  The proximal tibia was sized and it was felt that a size 7 tibial tray was appropriate. Tibial and femoral trials were inserted followed by insertion of a 5 mm polyethylene insert. This allowed for excellent mediolateral soft tissue balancing both in flexion and in full extension. Finally, the patella was cut and prepared so as to accommodate a 38 mm medialized dome patella. A patella trial was placed and the knee was placed through a range of motion with excellent patellar tracking appreciated. The femoral trial was removed after debridement of posterior osteophytes. The central post-hole for the tibial component was reamed followed by insertion of a keel punch. Tibial trials were then removed. Cut surfaces of bone were irrigated with copious amounts of normal saline with antibiotic solution using pulsatile lavage and then suctioned dry. Polymethylmethacrylate cement was prepared in the usual fashion using a vacuum mixer. Cement was applied to the cut surface of the proximal tibia as well as along the undersurface of a size 7 rotating platform tibial component. Tibial component was positioned and impacted into place. Excess cement was removed using [employer] fast streamer. Cement was then applied to the cut surfaces of the femur as well as along the posterior flanges of the size 7 femoral component. The femoral component was positioned and impacted into place. Excess cement was removed using [employer] fast streamer. A 5 mm polyethylene trial was inserted and the knee was brought into full extension with steady axial compression applied. Finally, cement was applied to the backside of a 38 mm medialized dome patella and the patellar component was positioned and patellar clamp applied. Excess cement was removed using [employer] fast streamer. After adequate curing of the cement, the tourniquet was deflated after a total tourniquet time of 101 minutes. Hemostasis was achieved using  electrocautery. The knee was irrigated with copious amounts of normal saline with antibiotic solution using pulsatile lavage and then suctioned dry. 20 mL of 1.3% Exparel and 60 mL of 0.25% Marcaine in 40 mL of normal saline was injected along the posterior capsule, medial and lateral gutters, and along the arthrotomy site. A 5 mm stabilized rotating platform polyethylene insert was inserted and the knee was placed through a range of motion with excellent mediolateral soft tissue balancing appreciated and excellent patellar tracking noted. 2 medium drains were placed in the wound bed and brought out through separate stab incisions to be attached to a reinfusion system. The medial parapatellar portion of the incision was reapproximated using interrupted sutures of #1 Vicryl. Subcutaneous tissue was approximated in layers using first #0 Vicryl followed #2-0 Vicryl. The skin was approximated with skin staples. A sterile dressing was applied.  The patient tolerated the procedure well and was transported to the recovery room in stable condition.    James P. Holley Bouche., M.D.

## 2016-08-18 NOTE — Progress Notes (Signed)
Pt arrived via bed from PACU at 1620. Pt is awake, alert and oriented x4, pain rating ot r knee is 3/10. Pt is wearing glasses. Lungs are clear bilat, pt is on room air. S1S2 auscultated, abdomen is soft, bs hypoactive. Foley is draining yellow urine. R knee is wrapped from the top of his thigh to his ankle, poplar care is intact, drain also intact with 134mls bloody drainage noted on arrival. Per post report, drain was placed at 1505. PPP, cap erfill to both feet is wnl, pt is able to dorsiflexd both feet, and plantare flex both feet as well. Pt denied nausea. PIV #20 intact to L arm and to R wrist areas, both sites are free of redness and swelling. Iv ns hung to l arm at 141mls/hr per MD order. This Probation officer reviewed post op orders with pt, and pt received clear liquids and meds per order, including oxycodone for gradually incresing pain to r knee. Bone foam placed with explaination to the pt by charge nurse Lemmie Evens. Pt had no difficulty swallowing, ted is on L leg. Pt at this time is resting with hob elevated, watching tv.

## 2016-08-19 ENCOUNTER — Encounter: Payer: Self-pay | Admitting: Orthopedic Surgery

## 2016-08-19 LAB — BASIC METABOLIC PANEL
Anion gap: 3 — ABNORMAL LOW (ref 5–15)
BUN: 13 mg/dL (ref 6–20)
CHLORIDE: 104 mmol/L (ref 101–111)
CO2: 27 mmol/L (ref 22–32)
Calcium: 8.3 mg/dL — ABNORMAL LOW (ref 8.9–10.3)
Creatinine, Ser: 0.76 mg/dL (ref 0.61–1.24)
GFR calc Af Amer: >60 mL/min (ref >60)
GFR calc non Af Amer: >60 mL/min (ref >60)
GLUCOSE: 111 mg/dL — AB (ref 65–99)
POTASSIUM: 4.2 mmol/L (ref 3.5–5.1)
Sodium: 134 mmol/L — ABNORMAL LOW (ref 135–145)

## 2016-08-19 LAB — CBC
HEMATOCRIT: 35 % — AB (ref 40.0–52.0)
Hemoglobin: 12.2 g/dL — ABNORMAL LOW (ref 13.0–18.0)
MCH: 30.3 pg (ref 26.0–34.0)
MCHC: 34.8 g/dL (ref 32.0–36.0)
MCV: 86.9 fL (ref 80.0–100.0)
PLATELETS: 194 10*3/uL (ref 150–440)
RBC: 4.03 MIL/uL — AB (ref 4.40–5.90)
RDW: 13.4 % (ref 11.5–14.5)
WBC: 11.4 10*3/uL — ABNORMAL HIGH (ref 3.8–10.6)

## 2016-08-19 MED ORDER — OXYCODONE HCL 5 MG PO TABS: 5.0000 mg | ORAL_TABLET | ORAL | 0 refills | Status: DC | PRN

## 2016-08-19 MED ORDER — TRAMADOL HCL 50 MG PO TABS: 50.0000 mg | ORAL_TABLET | ORAL | 1 refills | Status: DC | PRN

## 2016-08-19 MED ORDER — ENOXAPARIN SODIUM 40 MG/0.4ML ~~LOC~~ SOLN: 40.0000 mg | SUBCUTANEOUS | 0 refills | Status: DC

## 2016-08-19 NOTE — Discharge Instructions (Signed)

## 2016-08-19 NOTE — Discharge Summary (Signed)
Physician Discharge Summary  Patient ID: Seth Holt MRN: [id number] DOB/AGE: 07/06/1946 70 y.o.  Admit date: 08/18/2016 Discharge date: 08/20/2016  Admission Diagnoses:  primary osteoarthritis   Discharge Diagnoses: Patient Active Problem List   Diagnosis Date Noted  . S/P total knee arthroplasty 08/18/2016    Past Medical History:  Diagnosis Date  . Asthma    enviromental allergies  . GERD (gastroesophageal reflux disease)      Transfusion: none   Consultants (if any): none  Discharged Condition: Improved  Hospital Course: Seth Holt is an 70 y.o. male who was admitted 08/18/2016 with a diagnosis of degenerative arthrosis to right knee and went to the operating room on 08/18/2016 and underwent the above named procedures.    Surgeries:Procedure(s): COMPUTER ASSISTED TOTAL KNEE ARTHROPLASTY on 08/18/2016  PRE-OPERATIVE DIAGNOSIS: Degenerative arthrosis of the right knee, primary  POST-OPERATIVE DIAGNOSIS:  Same  PROCEDURE:  Right total knee arthroplasty using computer-assisted navigation  SURGEON:  Marciano Sequin. M.D.  ASSISTANT:  Vance Peper, PA (present and scrubbed throughout the case, critical for assistance with exposure, retraction, instrumentation, and closure)  ANESTHESIA: spinal  ESTIMATED BLOOD LOSS: 50 mL  FLUIDS REPLACED: 1150 mL of crystalloid  TOURNIQUET TIME: 101 minutes  DRAINS: 2 medium drains to a reinfusion system  SOFT TISSUE RELEASES: Anterior cruciate ligament, posterior cruciate ligament, deep medial collateral ligament, patellofemoral ligament  IMPLANTS UTILIZED: DePuy Attune size 7 posterior stabilized femoral component (cemented), size 7 rotating platform tibial component (cemented), 38 mm medialized dome patella (cemented), and a 5 mm stabilized rotating platform polyethylene insert.  INDICATIONS FOR SURGERY: Seth Holt is a 70 y.o. year old male with a long history of progressive knee pain. X-rays  demonstrated severe degenerative changes in tricompartmental fashion. The patient had not seen any significant improvement despite conservative nonsurgical intervention. After discussion of the risks and benefits of surgical intervention, the patient expressed understanding of the risks benefits and agree with plans for total knee arthroplasty.   The risks, benefits, and alternatives were discussed at length including but not limited to the risks of infection, bleeding, nerve injury, stiffness, blood clots, the need for revision surgery, cardiopulmonary complications, among others, and they were willing to proceed. Patient tolerated the surgery well. No complications .Patient was taken to PACU where she was stabilized and then transferred to the orthopedic floor.  Patient started on Lovenox 30 q 12 hrs. Foot pumps applied bilaterally at 80 mm hgb. Heels elevated off bed with rolled towels. No evidence of DVT. Calves non tender. Negative Homan. Physical therapy started on day #1 for gait training and transfer with OT starting on  day #1 for ADL and assisted devices. Patient has done well with therapy. Ambulated greater than 200 feet upon being discharged. Was able to go up and down 4 steps independently.  Patient's IV and foley were discontinued on day one  and hemovac was d/c on day #2. Dressing changed on day two.  Patient had difficulty with voiding following removal of foley. At the time of insertion, he had a little difficulty with insertion of foley but was minimal. Possibly a small stricture.   He was given perioperative antibiotics:  Anti-infectives    Start     Dose/Rate Route Frequency Ordered Stop   08/18/16 1630  ceFAZolin (ANCEF) IVPB 2g/100 mL premix     2 g 200 mL/hr over 30 Minutes Intravenous Every 6 hours 08/18/16 1629 08/19/16 1629   08/18/16 0742  ceFAZolin (ANCEF) 2-4 GM/100ML-% IVPB  CommentsMerrilyn Puma, ASHLEY: cabinet override      08/18/16 0742 08/18/16 1944   08/18/16  0005  ceFAZolin (ANCEF) IVPB 2g/100 mL premix  Status:  Discontinued     2 g 200 mL/hr over 30 Minutes Intravenous On call to O.R. 08/18/16 0005 08/18/16 1012    .  He was fitted with AV 1 compression foot pump devices, instructed on heel pump, early ambulation, and TED stockings for DVT prophylaxis.  He benefited maximally from the hospital stay and there were no complications.    Recent vital signs:  Vitals:   08/19/16 0340 08/19/16 0716  BP: 110/63 131/70  Pulse: 74 67  Resp: 18 18  Temp: 97.8 F (36.6 C) 97.8 F (36.6 C)    Recent laboratory studies:  Lab Results  Component Value Date   HGB 12.2 (L) 08/19/2016   HGB 15.0 08/04/2016   Lab Results  Component Value Date   WBC 11.4 (H) 08/19/2016   PLT 194 08/19/2016   Lab Results  Component Value Date   INR 1.02 08/04/2016   Lab Results  Component Value Date   NA 134 (L) 08/19/2016   K 4.2 08/19/2016   CL 104 08/19/2016   CO2 27 08/19/2016   BUN 13 08/19/2016   CREATININE 0.76 08/19/2016   GLUCOSE 111 (H) 08/19/2016    Discharge Medications:     Medication List    STOP taking these medications   Fish Oil 1000 MG Caps   ibuprofen 200 MG tablet Commonly known as:  ADVIL,MOTRIN   naproxen sodium 220 MG tablet Commonly known as:  ANAPROX     TAKE these medications   acetaminophen 500 MG chewable tablet Commonly known as:  TYLENOL Chew 500 mg by mouth every 6 (six) hours as needed for pain.   aspirin 325 MG tablet Take 325 mg by mouth every 6 (six) hours as needed.   CALCIUM 600 600 MG Tabs tablet Generic drug:  calcium carbonate Take 600 mg by mouth daily with breakfast.   calcium carbonate 750 MG chewable tablet Commonly known as:  TUMS EX Chew 1 tablet by mouth 2 (two) times daily as needed for heartburn.   CHLORPHEN 4 MG tablet Generic drug:  chlorpheniramine Take 4 mg by mouth 2 (two) times daily as needed for allergies.   enoxaparin 40 MG/0.4ML injection Commonly known as:   LOVENOX Inject 0.4 mLs (40 mg total) into the skin daily.   famotidine 20 MG tablet Commonly known as:  PEPCID Take 20 mg by mouth 2 (two) times daily as needed for heartburn or indigestion.   glucosamine-chondroitin 500-400 MG tablet Take 3 tablets by mouth daily.   menthol-cetylpyridinium 3 MG lozenge Commonly known as:  CEPACOL Take 1 lozenge by mouth as needed for sore throat.   multivitamin capsule Take 1 capsule by mouth daily.   oxyCODONE 5 MG immediate release tablet Commonly known as:  Oxy IR/ROXICODONE Take 1-2 tablets (5-10 mg total) by mouth every 4 (four) hours as needed for severe pain or breakthrough pain.   sodium chloride 0.65 % Soln nasal spray Commonly known as:  OCEAN Place 1 spray into both nostrils as needed for congestion.   traMADol 50 MG tablet Commonly known as:  ULTRAM Take 1-2 tablets (50-100 mg total) by mouth every 4 (four) hours as needed for moderate pain.       Diagnostic Studies: Dg Knee Right Port  Result Date: 08/18/2016 CLINICAL DATA:  Status post right knee replacement. EXAM: PORTABLE RIGHT  KNEE - 1-2 VIEW COMPARISON:  None. FINDINGS: Total knee arthroplasty without periprosthetic fracture or subluxation. Surgical drain and skin staples in place. IMPRESSION: No unexpected finding after total knee arthroplasty. Electronically Signed   By: Monte Fantasia M.D.   On: 08/18/2016 15:54    Disposition: Final discharge disposition not confirmed  Discharge Instructions    Diet - low sodium heart healthy    Complete by:  As directed    Increase activity slowly    Complete by:  As directed       Follow-up Information    WOLFE,JON R., PA On 08/31/2016.   Specialty:  Physician Assistant Why:  at 9:45am Contact information: [address] [place] [address] [phone]        Dereck Leep, MD On 09/28/2016.   Specialty:  Orthopedic Surgery Why:  at 1:45pm Contact information: [place] [address] [phone]            Signed: Watt Climes 08/19/2016, 7:58 AM

## 2016-08-19 NOTE — Evaluation (Signed)
Physical Therapy Evaluation Patient Details Name: LINLEY PENARANDA MRN: [id number] DOB: 1946/02/14 Today's Date: 08/19/2016   History of Present Illness  Pt. 70y.o. male, admitted to [facility] s/p R TKA, WBAT   Clinical Impression  Pt. Awake, alert, oriented in bed upon arrival, pt. Eager and motivated to participate in activity. Pt. Demonstrates B UE strength grossly 5/5, L LE strength grossly 5/5, unable to formally assess R LE strength due to pain although pt. Demonstrates ability to raise against gravity at least 3/5 strength. R Knee AROM 7-68 degrees, pt. Subjectively reports he was unable to achieve full extension in the R knee prior to TKA. Able to complete supine LE exercises demonstrating full SLR with minimal quad lag and good concentric and eccentric quad control.  Pt. Transferred supine to sitting EOB with supervision assist able to initiate and complete movement of B LE and support trunk with use of single bedrail for UE support and HOB elevated. Pt. Demonstrates ability to stand from EOB with RW CGA demonstrating safe technique pushing off of bed with one UE with contralateral UE on RW without verbal instruction. Pt. Demonstrated safe technique when transferring stand to sit in recliner chair as well CGA. Pt. Able to ambulate approx. 119ft. With use of RW for B UE support CGA initially demonstrating step to pattern leading with the R LE pt. Able to progress to step through pattern with verbal instruction. Pt. Shows decreased weight acceptance/stance time through R LE but no evidence of buckling or LOB. Pt. Reports that he owns a RW already, discussed how to properly assess and adjust RW to appropriate height for pt. To provide optimal function, pt. Verbalized understanding. Pt. Very motivated to participate in PT and progress activity. Would benefit from skilled PT to address above deficits and promote optimal return to [place] [facility] to follow up with skilled PT needs upon d/c. Will  continue to progress mobility through admission and update recommendations as appropriate.     Follow Up Recommendations Home health PT    Equipment Recommendations  Rolling walker with 5" wheels    Recommendations for Other Services       Precautions / Restrictions Precautions Precautions: Fall Restrictions Weight Bearing Restrictions: Yes RLE Weight Bearing: Weight bearing as tolerated      Mobility  Bed Mobility Overal bed mobility: Needs Assistance Bed Mobility: Supine to Sit     Supine to sit: Supervision     General bed mobility comments: Pt. able to initiate B LE movement to EOB and position legs over EOB, able to use position trunk and UE with unilateral UE assist of bedrail, HOB elevated  Transfers Overall transfer level: Needs assistance Equipment used: Rolling walker (2 wheeled) Transfers: Sit to/from Stand Sit to Stand: Min guard         General transfer comment: Pt. demonstrates safe transfer technique without verbal cues, pushed up from seating surface with one UE, other UE on RW. R LE placed slightly anterior to BOS.   Ambulation/Gait Ambulation/Gait assistance: Min guard Ambulation Distance (Feet): 150 Feet Assistive device: Rolling walker (2 wheeled)       General Gait Details: Pt. initally demonstrated step to pattern leading with R LE, able to progress to step through pattern with verbal cues. use of RW for B UE support, decreased weight acceptance/stance time on R LE, no evidence of buckling or LOB.   Stairs            Wheelchair Mobility    Modified  Rankin (Stroke Patients Only)       Balance Overall balance assessment: Needs assistance Sitting-balance support: Feet supported;No upper extremity supported Sitting balance-Leahy Scale: Good     Standing balance support: Bilateral upper extremity supported Standing balance-Leahy Scale: Good                               Pertinent Vitals/Pain Pain Assessment:  0-10 Pain Score: 5  Pain Location: R knee  Pain Descriptors / Indicators: Aching Pain Intervention(s): Monitored during session;Premedicated before session;Ice applied    Home Living Family/patient expects to be discharged to:: Private residence Living Arrangements: Spouse/significant other Available Help at Discharge: Family Type of Home: House Home Access: Stairs to enter Entrance Stairs-Rails: Can reach both Entrance Stairs-Number of Steps: 3 Home Layout: Able to live on main level with bedroom/bathroom Home Equipment: Walker - 2 wheels;Crutches;Cane - single point      Prior Function Level of Independence: Independent         Comments: Pt. independent with ADL's prior to surgery. Pt. reports he bikes 1-2 miles daily     Hand Dominance   Dominant Hand: Right    Extremity/Trunk Assessment   Upper Extremity Assessment: Overall WFL for tasks assessed (UE strength grossly 5/5 symmetrical)           Lower Extremity Assessment: Overall WFL for tasks assessed;RLE deficits/detail RLE Deficits / Details:  (unable to perform formal strength assessment of R LE, pt. demonstrates ability to lift against gravity at least 3/5) LLE Deficits / Details:  (LLE strength grossly 5/5 )     Communication   Communication: No difficulties  Cognition Arousal/Alertness: Awake/alert Behavior During Therapy: WFL for tasks assessed/performed Overall Cognitive Status: Within Functional Limits for tasks assessed                      General Comments      Exercises Total Joint Exercises Goniometric ROM: 7-68 degrees R knee (Pt. reports he was unable to achieve full extension in R knee prior to surgery ) Other Exercises Other Exercises: supine R LE exercises for AROM and strengthening x10; Ankle pumps, heel slides, SLR, SAQ, quad sets.    Assessment/Plan    PT Assessment Patient needs continued PT services  PT Problem List Decreased strength;Decreased range of  motion;Decreased balance;Decreased mobility;Decreased activity tolerance;Decreased knowledge of use of DME          PT Treatment Interventions DME instruction;Gait training;Therapeutic activities;Therapeutic exercise;Stair training;Balance training;Functional mobility training;Patient/family education    PT Goals (Current goals can be found in the Care Plan section)  Acute Rehab PT Goals Patient Stated Goal: Pt. would like to return to independent activity and recreational biking  PT Goal Formulation: With patient Time For Goal Achievement: 09/02/16 Potential to Achieve Goals: Good    Frequency BID   Barriers to discharge        Co-evaluation               End of Session Equipment Utilized During Treatment: Gait belt (Pt. able to perform SLR min. quad lag, deferred use of knee immobilizer) Activity Tolerance: Patient tolerated treatment well Patient left: in chair;with call bell/phone within reach;with chair alarm set;with SCD's reapplied (cryocuff applied, bone foam on RLE )           Time: [id number] PT Time Calculation (min) (ACUTE ONLY): 29 min   Charges:         PT  G Codes:        Melanie Crazier 08/19/2016, 10:43 AM

## 2016-08-19 NOTE — Care Management Note (Signed)
Case Management Note  Patient Details  Name: DAMARCO KEYSOR MRN: [id number] Date of Birth: 10/17/1946  Subjective/Objective:   POD 1 right TKA. Met with patient at bedside. He lives at home with his wife who will be his caregiver after discharge. It is anticipated that patient will discharge tomorrow. Discussed HH PT. Offered choice of home care agencies. No preference. Referred to Tim with [facility]. Patient uses [place] [phone]. Called Lovenox 40 mg # 14, no refills. Patient has a walker, crutches and a cane at home.                   Action/Plan: HH PT with [facility]. Called Lovenox to [place], [address]. No DME needed.   Expected Discharge Date:  08/20/2016              Expected Discharge Plan:  [place]  In-House Referral:     Discharge planning Services  CM Consult  Post Acute Care Choice:  Home Health Choice offered to:  Patient  DME Arranged:    DME Agency:     HH Arranged:  PT [place]:  [facility] (now [facility])  Status of Service:  In process, will continue to follow  If discussed at Long Length of Stay Meetings, dates discussed:    Additional Comments:  Jolly Mango, RN 08/19/2016, 11:26 AM

## 2016-08-19 NOTE — Progress Notes (Signed)
   Subjective: 1 Day Post-Op Procedure(s) (LRB): COMPUTER ASSISTED TOTAL KNEE ARTHROPLASTY (Right) Patient reports pain as 5 on 0-10 scale.   Patient is well, and has had no acute complaints or problems We will start therapy today.  Plan is to go Home after hospital stay. no nausea and no vomiting Patient denies any chest pains or shortness of breath. Objective: Vital signs in last 24 hours: Temp:  [96.8 F (36 C)-98.2 F (36.8 C)] 97.8 F (36.6 C) (09/21 0716) Pulse Rate:  [57-96] 67 (09/21 0716) Resp:  [12-22] 18 (09/21 0716) BP: (96-146)/(53-85) 131/70 (09/21 0716) SpO2:  [95 %-100 %] 100 % (09/21 0716) Weight:  [117 kg (258 lb)] 117 kg (258 lb) (09/20 1015) Wound do able to be elevated on today's visit Heels are non tender and elevated off the bed using rolled towels Intake/Output from previous day: 09/20 0701 - 09/21 0700 In: 3348.3 [P.O.:800; I.V.:1603.3; Blood:373; IV Piggyback:200] Out: [id number] [Urine:1150; Drains:150; Blood:50] Intake/Output this shift: No intake/output data recorded.   Recent Labs  08/19/16 0337  HGB 12.2*    Recent Labs  08/19/16 0337  WBC 11.4*  RBC 4.03*  HCT 35.0*  PLT 194    Recent Labs  08/19/16 0337  NA 134*  K 4.2  CL 104  CO2 27  BUN 13  CREATININE 0.76  GLUCOSE 111*  CALCIUM 8.3*   No results for input(s): LABPT, INR in the last 72 hours.  EXAM General - Patient is Alert, Appropriate and Oriented Extremity - Neurologically intact Neurovascular intact Sensation intact distally Intact pulses distally Dorsiflexion/Plantar flexion intact Dressing - dressing C/D/I Motor Function - intact, moving foot and toes well on exam. Pt able to do SLR on own  Past Medical History:  Diagnosis Date  . Asthma    enviromental allergies  . GERD (gastroesophageal reflux disease)     Assessment/Plan: 1 Day Post-Op Procedure(s) (LRB): COMPUTER ASSISTED TOTAL KNEE ARTHROPLASTY (Right) Active Problems:   S/P total knee  arthroplasty  Estimated body mass index is 34.99 kg/m as calculated from the following:   Height as of 08/04/16: 6' (1.829 m).   Weight as of this encounter: 117 kg (258 lb). Advance diet Up with therapy D/C IV fluids Plan for discharge tomorrow Discharge home with home health  Labs: reviewed DVT Prophylaxis - Lovenox, Foot Pumps and TED hose Weight-Bearing as tolerated to right leg D/C O2 and Pulse OX and try on Room Air Begin working on a bowel movement Labs in am  [employer]. Marion [place]uku 08/19/2016, 7:43 AM

## 2016-08-19 NOTE — Care Management (Addendum)
Unable to get cost of Lovenox. [facility] states they have no insurance on file. Cash price is $158.00. Patient updated. He will get wife to go by and give insurance information.

## 2016-08-19 NOTE — Plan of Care (Signed)
Problem: Skin Integrity: Goal: Signs of wound healing will improve Outcome: Progressing Pt is progressing toward discharge, has ambulated twice with a walker and gait has improved. Plans in place to d/c pt home on 9/22.

## 2016-08-19 NOTE — Evaluation (Signed)
Occupational Therapy Evaluation Patient Details Name: ALFONZA BRIESE MRN: [id number] DOB: [birth year] Today's Date: 08/19/2016    History of Present Illness Pt. 70 y.o. male, admitted to [facility] s/p R TKA, WBAT    Clinical Impression   Pt. Is a 70 y.o. male who was admitted for a right TKR. Pt presents with limited ROM, Pain, weakness, and impaired functional mobility which hinder his/her ability to complete ADL and IADL tasks. Pt. could benefit from skilled OT services to review A/E use for LE ADLs, to review necessary home modifications, and to improve functional mobility for ADL/IADLs in order to work towards regaining Independence with ADL/IADLs.     Follow Up Recommendations  No OT follow up    Equipment Recommendations       Recommendations for Other Services       Precautions / Restrictions Precautions Precautions: Fall Restrictions Weight Bearing Restrictions: Yes RLE Weight Bearing: Weight bearing as tolerated                                                     ADL Overall ADL's : Needs assistance/impaired Eating/Feeding: Set up   Grooming: Set up           Upper Body Dressing : Set up   Lower Body Dressing: Moderate assistance;Minimal assistance                 General ADL Comments: Pt. education was provided about A/E for LE dressing skills.     Vision     Perception     Praxis      Pertinent Vitals/Pain Pain Assessment: 0-10 Pain Score: 5      Hand Dominance Right   Extremity/Trunk Assessment Upper Extremity Assessment Upper Extremity Assessment: Overall WFL for tasks assessed           Communication Communication Communication: No difficulties   Cognition Arousal/Alertness: Awake/alert Behavior During Therapy: WFL for tasks assessed/performed Overall Cognitive Status: Within Functional Limits for tasks assessed                     General Comments       Exercises       Shoulder  Instructions      Home Living Family/patient expects to be discharged to:: Private residence Living Arrangements: Spouse/significant other Available Help at Discharge: Family Type of Home: House Home Access: Stairs to enter [employer] of Steps: 3   Home Layout: Multi-level;Able to live on main level with bedroom/bathroom     Bathroom Shower/Tub: Tub/shower unit Shower/tub characteristics: Curtain Biochemist, clinical: Handicapped height     Home Equipment: Grab bars - tub/shower;Shower seat;Walker - 4 wheels;Walker - 2 wheels;Cane - single point          Prior Functioning/Environment Level of Independence: Independent                 OT Problem List: Decreased strength;Decreased activity tolerance;Decreased knowledge of use of DME or AE;Impaired UE functional use;Pain   OT Treatment/Interventions: Self-care/ADL training;Therapeutic exercise;DME and/or AE instruction;Patient/family education;Therapeutic activities    OT Goals(Current goals can be found in the care plan section) Acute Rehab OT Goals Patient Stated Goal: Tbe able to go hiking. OT Goal Formulation: With patient Potential to Achieve Goals: Good  OT Frequency: Min 1X/week   Barriers to D/C:  Co-evaluation              End of Session Equipment Utilized During Treatment: Gait belt  Activity Tolerance: Patient tolerated treatment well Patient left: in chair;with call bell/phone within reach;with chair alarm set   Time: [phone] OT Time Calculation (min): 30 min Charges:  OT General Charges $OT Visit: 1 Procedure OT Evaluation $OT Eval Moderate Complexity: 1 Procedure OT Treatments $Self Care/Home Management : 8-22 mins G-Codes:    Harrel Carina, MS, OTR/L 08/19/2016, 10:30 AM

## 2016-08-19 NOTE — Progress Notes (Signed)
Shift assessment completed at 0800. Pt is resting in bed at that time. On room air, lungs clear bilat. S1S2 heard, abdomen is soft, bs heard.. Pt had not yet voided since foley removal. R leg has dressing in place from top of his thigh to his ankle, polar care is intact, hemovac intact with dark red draiange noted. Pt is able to flex both feet, foot pumps in place. PIV #20 intact to l arm with iv ns infusing at 164mls/hr, site si free of redness and swelling, PIV #20 intact to r wrist as well. Dr. Marry Guan in to see pt with Pa. Physical therapy has walked pt around the uit twice, pt has spent most of the shift oob to recliner, si returning to bed. Pt still hnv this shift, stated he feels no need to, this writer has explained need for bladder scan and subsequent cath if not possible. Hemovac has drained 463mls total this shift, pt has had regular medication for pain, is able to bend his knee, using walker. Will monitor.

## 2016-08-19 NOTE — Progress Notes (Signed)
Pt has been bladder scanned for 173mls, pt is given po fluids and ivf is dc'd at this time, pt is encouraged ot make himself drink.

## 2016-08-19 NOTE — Progress Notes (Signed)
Clinical Social Worker (CSW) received SNF consult. PT is recommending home health. RN case manager is aware of above. Please reconsult if future social work needs arise. CSW signing off.   Graysen Woodyard, LCSW [phone] 

## 2016-08-19 NOTE — Anesthesia Postprocedure Evaluation (Signed)
Anesthesia Post Note  Patient: Seth Holt  Procedure(s) Performed: Procedure(s) (LRB): COMPUTER ASSISTED TOTAL KNEE ARTHROPLASTY (Right)  Patient location during evaluation: Nursing Unit Anesthesia Type: Spinal Level of consciousness: awake and alert, oriented and patient cooperative Pain management: pain level controlled Vital Signs Assessment: post-procedure vital signs reviewed and stable Respiratory status: spontaneous breathing and respiratory function stable Cardiovascular status: stable Postop Assessment: no headache, no backache, no signs of nausea or vomiting and adequate PO intake Anesthetic complications: no    Last Vitals:  Vitals:   08/19/16 0340 08/19/16 0716  BP: 110/63 131/70  Pulse: 74 67  Resp: 18 18  Temp: 36.6 C 36.6 C    Last Pain:  Vitals:   08/19/16 0716  TempSrc: Oral  PainSc:                  Virgilio Frees

## 2016-08-19 NOTE — Progress Notes (Signed)
Physical Therapy Treatment Patient Details Name: Seth Holt MRN: [id number] DOB: 09/04/1946 Today's Date: 08/19/2016    History of Present Illness Pt. 70y.o. male, admitted to [facility] s/p R TKA, WBAT     PT Comments    Pt. Awake, alert and oriented sitting in chair upon arrival. Pt. Remains eager and motivated to work with PT and progress activity. He was able to perform seated R LE exercise/stretching with verbal instruction. Pt.able to stand from recliner chair to RW with supervision assist. Able to ambulate approx. 357ft. CGA with use of RW for B UE support, he demonstrated a step through gait pattern, although decreased weight acceptance/stance time and decreased terminal extension on the RLE. No evidence of buckling or LOB. Pt. Able to transfer from stand to sit and sit to supine with use of bedrails for B UE support able to lift B LE and position trunk and all extremities in bed. Would benefit from skilled PT to address above deficits and promote optimal return to PLOF Based on pt.'s mobility progression would update recommendation to outpatient PT upon d/c to follow up with skilled PT needs, recommendation communicated to care manager.   Follow Up Recommendations  Outpatient PT     Equipment Recommendations  Rolling walker with 5" wheels    Recommendations for Other Services       Precautions / Restrictions Precautions Precautions: Fall Required Braces or Orthoses:  (Pt. able to perform SLR, knee immobilizer use deferred) Restrictions Weight Bearing Restrictions: Yes RLE Weight Bearing: Weight bearing as tolerated    Mobility  Bed Mobility Overal bed mobility: Modified Independent Bed Mobility: Sit to Supine       Sit to supine: Modified independent (Device/Increase time)   General bed mobility comments: Pt. able to lift B LE into bed and position whole body into supine.   Transfers Overall transfer level: Needs assistance Equipment used: Rolling walker (2  wheeled) Transfers: Sit to/from Stand Sit to Stand: Supervision         General transfer comment: Pt. demonstrates safe transfer technique without verbal cues, pushed up from seating surface with one UE, other UE on RW. R LE placed slightly anterior to BOS.   Ambulation/Gait Ambulation/Gait assistance: Min guard Ambulation Distance (Feet): 350 Feet Assistive device: Rolling walker (2 wheeled)       General Gait Details: Pt. able to demonstrate step through pattern during entire ambulation distance. Demonstrates decreased weight acceptance/stance time, and decreased terminal extension in stance on RLE. no LOB or bukling    Financial trader Rankin (Stroke Patients Only)       Balance Overall balance assessment: Needs assistance Sitting-balance support: Feet supported Sitting balance-Leahy Scale: Good     Standing balance support: Bilateral upper extremity supported Standing balance-Leahy Scale: Good                      Cognition Arousal/Alertness: Awake/alert Behavior During Therapy: WFL for tasks assessed/performed Overall Cognitive Status: Within Functional Limits for tasks assessed                      Exercises Other Exercises Other Exercises: sitting exercises for R LE LAQ x10 and knee flexion stretching 3 x30seconds    General Comments        Pertinent Vitals/Pain Pain Assessment: 0-10 Pain Score: 6  Pain Location: R knee  Pain Descriptors / Indicators: Aching  Pain Intervention(s): Monitored during session;Ice applied    Home Living                      Prior Function            PT Goals (current goals can now be found in the care plan section) Acute Rehab PT Goals Patient Stated Goal: Pt. would like to return to independent activity and recreational biking  PT Goal Formulation: With patient Time For Goal Achievement: 09/02/16 Potential to Achieve Goals: Good Progress towards PT  goals: Progressing toward goals    Frequency    BID      PT Plan Discharge plan needs to be updated    Co-evaluation             End of Session Equipment Utilized During Treatment: Gait belt (pt. able to perform SLR, min quad lag, knee immobilizer deferred) Activity Tolerance: Patient tolerated treatment well Patient left: in bed;with call bell/phone within reach;with nursing/sitter in room;with bed alarm set (Nurse in room performing bladder scan)     Time: [id number] PT Time Calculation (min) (ACUTE ONLY): 18 min  Charges:                       G Codes:       Melanie Crazier, SPT  08/19/16,4:22 PM

## 2016-08-19 NOTE — Care Management (Signed)
Patient has decided he would like to do home health PT not OP PT.

## 2016-08-20 LAB — CBC
HEMATOCRIT: 32.1 % — AB (ref 40.0–52.0)
Hemoglobin: 11.5 g/dL — ABNORMAL LOW (ref 13.0–18.0)
MCH: 31 pg (ref 26.0–34.0)
MCHC: 35.8 g/dL (ref 32.0–36.0)
MCV: 86.5 fL (ref 80.0–100.0)
Platelets: 183 10*3/uL (ref 150–440)
RBC: 3.71 MIL/uL — ABNORMAL LOW (ref 4.40–5.90)
RDW: 13.2 % (ref 11.5–14.5)
WBC: 8.4 10*3/uL (ref 3.8–10.6)

## 2016-08-20 LAB — BASIC METABOLIC PANEL
ANION GAP: 6 (ref 5–15)
BUN: 8 mg/dL (ref 6–20)
CALCIUM: 8.6 mg/dL — AB (ref 8.9–10.3)
CHLORIDE: 102 mmol/L (ref 101–111)
CO2: 28 mmol/L (ref 22–32)
Creatinine, Ser: 0.8 mg/dL (ref 0.61–1.24)
GFR calc Af Amer: >60 mL/min (ref >60)
GFR calc non Af Amer: >60 mL/min (ref >60)
GLUCOSE: 99 mg/dL (ref 65–99)
Potassium: 3.9 mmol/L (ref 3.5–5.1)
Sodium: 136 mmol/L (ref 135–145)

## 2016-08-20 NOTE — Progress Notes (Signed)
Darene Lamer Grassi to be D/C'd Home per MD order.  Discussed with the patient and all questions fully answered.  VSS, Skin clean, dry and intact without evidence of skin break down, no evidence of skin tears noted. IV catheter discontinued intact. Site without signs and symptoms of complications. Dressing and pressure applied.  An After Visit Summary was printed and given to the patient. Patient received prescription.  D/c education completed with patient/family including follow up instructions, medication list, d/c activities limitations if indicated, with other d/c instructions as indicated by MD - patient able to verbalize understanding, all questions fully answered.   Patient instructed to return to ED, call 911, or call MD for any changes in condition.   Patient escorted via [place], and D/C home via private auto.  Deri Fuelling 08/20/2016 12:21 PM

## 2016-08-20 NOTE — Progress Notes (Signed)
   Subjective: 2 Days Post-Op Procedure(s) (LRB): COMPUTER ASSISTED TOTAL KNEE ARTHROPLASTY (Right) Patient reports pain as 1 on 0-10 scale.   Patient is well, and has had no acute complaints or problems Continue with physical therapy today.  Plan is to go Home after hospital stay. no nausea and no vomiting Patient denies any chest pains or shortness of breath. Pt having some problem with voiding.  Objective: Vital signs in last 24 hours: Temp:  [97.7 F (36.5 C)-98 F (36.7 C)] 98 F (36.7 C) (09/22 0306) Pulse Rate:  [66-72] 72 (09/22 0306) Resp:  [19-20] 19 (09/22 0306) BP: (128-137)/(62-67) 132/66 (09/22 0306) SpO2:  [99 %-100 %] 99 % (09/22 0306) well approximated incision Heels are non tender and elevated off the bed using rolled towels Intake/Output from previous day: 09/21 0701 - 09/22 0700 In: 3520 [P.O.:1320; I.V.:2200] Out: [id number] [Urine:3200; Drains:745] Intake/Output this shift: No intake/output data recorded.   Recent Labs  08/19/16 0337 08/20/16 0628  HGB 12.2* 11.5*    Recent Labs  08/19/16 0337 08/20/16 0628  WBC 11.4* 8.4  RBC 4.03* 3.71*  HCT 35.0* 32.1*  PLT 194 183    Recent Labs  08/19/16 0337 08/20/16 0628  NA 134* 136  K 4.2 3.9  CL 104 102  CO2 27 28  BUN 13 8  CREATININE 0.76 0.80  GLUCOSE 111* 99  CALCIUM 8.3* 8.6*   No results for input(s): LABPT, INR in the last 72 hours.  EXAM General - Patient is Alert, Appropriate and Oriented Extremity - Neurologically intact Neurovascular intact Sensation intact distally Intact pulses distally Dorsiflexion/Plantar flexion intact Dressing - dressing C/D/I Motor Function - intact, moving foot and toes well on exam.    Past Medical History:  Diagnosis Date  . Asthma    enviromental allergies  . GERD (gastroesophageal reflux disease)     Assessment/Plan: 2 Days Post-Op Procedure(s) (LRB): COMPUTER ASSISTED TOTAL KNEE ARTHROPLASTY (Right) Active Problems:   S/P total knee  arthroplasty  Estimated body mass index is 34.99 kg/m as calculated from the following:   Height as of 08/04/16: 6' (1.829 m).   Weight as of this encounter: 117 kg (258 lb). Up with therapy Discharge home with home health  Labs: reviewed DVT Prophylaxis - Lovenox, Foot Pumps and TED hose Weight-Bearing as tolerated to right leg Hemovac was discontinued Change dressing prior to d/c May need to get a urology consult  Jon R. Harper Loomis 08/20/2016, 7:29 AM

## 2016-08-20 NOTE — Care Management Important Message (Signed)
Important Message  Patient Details  Name: ALEXX GAYDEN MRN: [id number] Date of Birth: 04-27-1946   Medicare Important Message Given:  Yes    Jolly Mango, RN 08/20/2016, 9:24 AM

## 2016-08-20 NOTE — Progress Notes (Signed)
Patient was unable to void on two attempts with urinal, bladder scan revealed 624ml.  MD Poggi paged with new order to have foley in and d/c in am.

## 2016-08-20 NOTE — Progress Notes (Signed)
Physical Therapy Treatment Patient Details Name: Seth Holt MRN: [id number] DOB: 16-Nov-1946 Today's Date: 08/20/2016    History of Present Illness Pt. 70y.o. male, admitted to [facility] s/p R TKA, WBAT     PT Comments    Pt. Supine in bed upon arrival, eager to participate in activity and progress mobility. Pt. Demonstrates ability to transfer from supine to sitting EOB mod I with use of bedrail for UE assist, able to negotiate and position B LE and trunk, demonstrates normal sitting balance at EOB with B LE supported by the floor. He was able  to ascend/descend 4 steps CGA with B railings for UE support utilizing a step to pattern leading with the LLE when ascending and descending leading with the RLE. Pt. Performed approx. 347ft of ambulation with use of RW supervision assist demonstrating a step through pattern for entire distance, still decreased weight acceptance/stance time through R LE mild buckling noted when pt. Attempts to bear full weight through RLE during stance, able to correct independently. Improved AROM today 2-107 R knee ext/flex. Pt. Subjectively states he was unable to achieve full extension in R knee prior to surgery but feels he is able to achieve more at this time. Discussed home safety techniques upon d/c to decrease fall risk, also discussed car transfer and reviewed exercises pt. Can perform own his own to improve carry over of PT sessions. Would benefit from skilled PT to address above deficits and promote optimal return to PLOF Recommend outpatient PT to follow up with skilled PT needs upon d/c.   Follow Up Recommendations  Outpatient PT     Equipment Recommendations  Rolling walker with 5" wheels    Recommendations for Other Services       Precautions / Restrictions Precautions Precautions: Fall Restrictions Weight Bearing Restrictions: Yes RLE Weight Bearing: Weight bearing as tolerated    Mobility  Bed Mobility Overal bed mobility: Modified  Independent Bed Mobility: Supine to Sit     Supine to sit: Modified independent (Device/Increase time)     General bed mobility comments: Pt. able to lift and position B LE and trunk at EOB with use of bedrail for one UE assist.   Transfers Overall transfer level: Modified independent Equipment used: Rolling walker (2 wheeled) Transfers: Sit to/from Stand Sit to Stand: Modified independent (Device/Increase time)         General transfer comment: Pt. demonstrates safe transfer technique wihtout verbal cues reaching to/from stable seating surfaces. R LE still placed slightly anterior to BOS and demonstrates L lean throughout transfer.  Ambulation/Gait Ambulation/Gait assistance: Supervision Ambulation Distance (Feet): 360 Feet Assistive device: Rolling walker (2 wheeled)       General Gait Details: Pt. able to demonstrate step through pattern throughout ambulation distance. Demonstrates decreased weight acceptance/stance time and decreased terminal extension in stance on RLE. Mild buckling noted when pt. attempts to bear full weight through RLE during stance phase secondary to pain, pt. able to recover independently.  Requires B UE support from RW   Stairs Stairs: Yes Stairs assistance: Min guard Stair Management: Two rails Number of Stairs: 4 General stair comments: Pt. able to ascend/descend 4 steps with use B railings for UE support. Pt. demonstrated step to pattern, ascending with the LLE and descending leading with the RLE.   Wheelchair Mobility    Modified Rankin (Stroke Patients Only)       Balance Overall balance assessment: Modified Independent Sitting-balance support: Feet supported Sitting balance-Leahy Scale: Normal     Standing  balance support: Bilateral upper extremity supported Standing balance-Leahy Scale: Good                      Cognition Arousal/Alertness: Awake/alert Behavior During Therapy: WFL for tasks assessed/performed Overall  Cognitive Status: Within Functional Limits for tasks assessed                      Exercises Total Joint Exercises Goniometric ROM: 2-107 degrees R knee (Pt. reports he was unable to achieve full extension prior to surgery however, he states he feels he is able to get it straighter now) Other Exercises Other Exercises: sitting exercises for RLE AROM LAQx10 and knee flexion stretching 4x30 seconds     General Comments        Pertinent Vitals/Pain Pain Assessment: 0-10 Pain Score: 4  Pain Location: R knee  Pain Descriptors / Indicators: Aching Pain Intervention(s): Monitored during session;Premedicated before session;Ice applied    Home Living                      Prior Function            PT Goals (current goals can now be found in the care plan section) Acute Rehab PT Goals Patient Stated Goal: Pt. would like to return to independent activity and recreational biking  PT Goal Formulation: With patient Time For Goal Achievement: 09/02/16 Potential to Achieve Goals: Good Progress towards PT goals: Progressing toward goals    Frequency    BID      PT Plan Current plan remains appropriate    Co-evaluation             End of Session Equipment Utilized During Treatment: Gait belt Activity Tolerance: Patient tolerated treatment well Patient left: in chair;with call bell/phone within reach;with chair alarm set;with SCD's reapplied (cryocuff applied, towel roll under heel, )     Time: [id number] PT Time Calculation (min) (ACUTE ONLY): 23 min  Charges:                       G CodesMelanie Crazier 2016/08/21, 10:31 AM

## 2016-08-20 NOTE — Care Management Note (Signed)
Case Management Note  Patient Details  Name: Seth Holt MRN: [id number] Date of Birth: 1946/07/02  Subjective/Objective:  Discharge to home.                   Action/Plan: [facility] notified of discharge  Expected Discharge Date:     08/20/2016             Expected Discharge Plan:  [place]  In-House Referral:     Discharge planning Services  CM Consult  Post Acute Care Choice:  Home Health Choice offered to:  Patient  DME Arranged:    DME Agency:     HH Arranged:  PT [place]:  [facility] (now [facility])  Status of Service:  Completed, signed off  If discussed at [employer] of Stay Meetings, dates discussed:    Additional Comments:  Jolly Mango, RN 08/20/2016, 8:53 AM

## 2016-08-21 DIAGNOSIS — Z79891 Long term (current) use of opiate analgesic: Secondary | ICD-10-CM | POA: Diagnosis not present

## 2016-08-21 DIAGNOSIS — Z96651 Presence of right artificial knee joint: Secondary | ICD-10-CM | POA: Diagnosis not present

## 2016-08-21 DIAGNOSIS — Z7901 Long term (current) use of anticoagulants: Secondary | ICD-10-CM | POA: Diagnosis not present

## 2016-08-21 DIAGNOSIS — Z471 Aftercare following joint replacement surgery: Secondary | ICD-10-CM | POA: Diagnosis not present

## 2016-08-21 DIAGNOSIS — M1991 Primary osteoarthritis, unspecified site: Secondary | ICD-10-CM | POA: Diagnosis not present

## 2016-08-30 DIAGNOSIS — M1991 Primary osteoarthritis, unspecified site: Secondary | ICD-10-CM | POA: Diagnosis not present

## 2016-08-30 DIAGNOSIS — Z96651 Presence of right artificial knee joint: Secondary | ICD-10-CM | POA: Diagnosis not present

## 2016-08-30 DIAGNOSIS — Z7901 Long term (current) use of anticoagulants: Secondary | ICD-10-CM | POA: Diagnosis not present

## 2016-08-30 DIAGNOSIS — Z471 Aftercare following joint replacement surgery: Secondary | ICD-10-CM | POA: Diagnosis not present

## 2016-08-30 DIAGNOSIS — Z79891 Long term (current) use of opiate analgesic: Secondary | ICD-10-CM | POA: Diagnosis not present

## 2016-08-31 DIAGNOSIS — Z96651 Presence of right artificial knee joint: Secondary | ICD-10-CM | POA: Diagnosis not present

## 2016-09-03 DIAGNOSIS — Z96651 Presence of right artificial knee joint: Secondary | ICD-10-CM | POA: Diagnosis not present

## 2016-09-06 DIAGNOSIS — Z96651 Presence of right artificial knee joint: Secondary | ICD-10-CM | POA: Diagnosis not present

## 2016-09-08 DIAGNOSIS — Z96651 Presence of right artificial knee joint: Secondary | ICD-10-CM | POA: Diagnosis not present

## 2016-09-10 DIAGNOSIS — Z96651 Presence of right artificial knee joint: Secondary | ICD-10-CM | POA: Diagnosis not present

## 2016-09-13 DIAGNOSIS — Z96651 Presence of right artificial knee joint: Secondary | ICD-10-CM | POA: Diagnosis not present

## 2016-09-15 DIAGNOSIS — Z96651 Presence of right artificial knee joint: Secondary | ICD-10-CM | POA: Diagnosis not present

## 2016-09-20 DIAGNOSIS — Z96651 Presence of right artificial knee joint: Secondary | ICD-10-CM | POA: Diagnosis not present

## 2016-09-21 DIAGNOSIS — Z471 Aftercare following joint replacement surgery: Secondary | ICD-10-CM | POA: Diagnosis not present

## 2016-09-22 DIAGNOSIS — Z96651 Presence of right artificial knee joint: Secondary | ICD-10-CM | POA: Diagnosis not present

## 2016-09-28 DIAGNOSIS — Z96651 Presence of right artificial knee joint: Secondary | ICD-10-CM | POA: Diagnosis not present

## 2017-01-12 DIAGNOSIS — H2511 Age-related nuclear cataract, right eye: Secondary | ICD-10-CM | POA: Diagnosis not present

## 2017-06-07 DIAGNOSIS — Z0000 Encounter for general adult medical examination without abnormal findings: Secondary | ICD-10-CM | POA: Diagnosis not present

## 2017-06-07 DIAGNOSIS — Z1389 Encounter for screening for other disorder: Secondary | ICD-10-CM | POA: Diagnosis not present

## 2017-06-07 DIAGNOSIS — Z23 Encounter for immunization: Secondary | ICD-10-CM | POA: Diagnosis not present

## 2017-06-07 DIAGNOSIS — Z13220 Encounter for screening for lipoid disorders: Secondary | ICD-10-CM | POA: Diagnosis not present

## 2017-08-05 DIAGNOSIS — D1801 Hemangioma of skin and subcutaneous tissue: Secondary | ICD-10-CM | POA: Diagnosis not present

## 2017-08-05 DIAGNOSIS — D485 Neoplasm of uncertain behavior of skin: Secondary | ICD-10-CM | POA: Diagnosis not present

## 2017-08-05 DIAGNOSIS — L820 Inflamed seborrheic keratosis: Secondary | ICD-10-CM | POA: Diagnosis not present

## 2017-08-05 DIAGNOSIS — L821 Other seborrheic keratosis: Secondary | ICD-10-CM | POA: Diagnosis not present

## 2017-08-05 DIAGNOSIS — B078 Other viral warts: Secondary | ICD-10-CM | POA: Diagnosis not present

## 2017-08-05 DIAGNOSIS — C44319 Basal cell carcinoma of skin of other parts of face: Secondary | ICD-10-CM | POA: Diagnosis not present

## 2017-08-05 DIAGNOSIS — L538 Other specified erythematous conditions: Secondary | ICD-10-CM | POA: Diagnosis not present

## 2017-08-05 DIAGNOSIS — D0461 Carcinoma in situ of skin of right upper limb, including shoulder: Secondary | ICD-10-CM | POA: Diagnosis not present

## 2017-08-05 DIAGNOSIS — L0889 Other specified local infections of the skin and subcutaneous tissue: Secondary | ICD-10-CM | POA: Diagnosis not present

## 2017-10-05 IMAGING — DX DG KNEE 1-2V PORT*R*
2 series · 2 of 2 positions shown · non-contrast
Comparison: None.

CLINICAL DATA: Status post right knee replacement.

EXAM:
PORTABLE RIGHT KNEE - 1-2 VIEW

[knee ap]
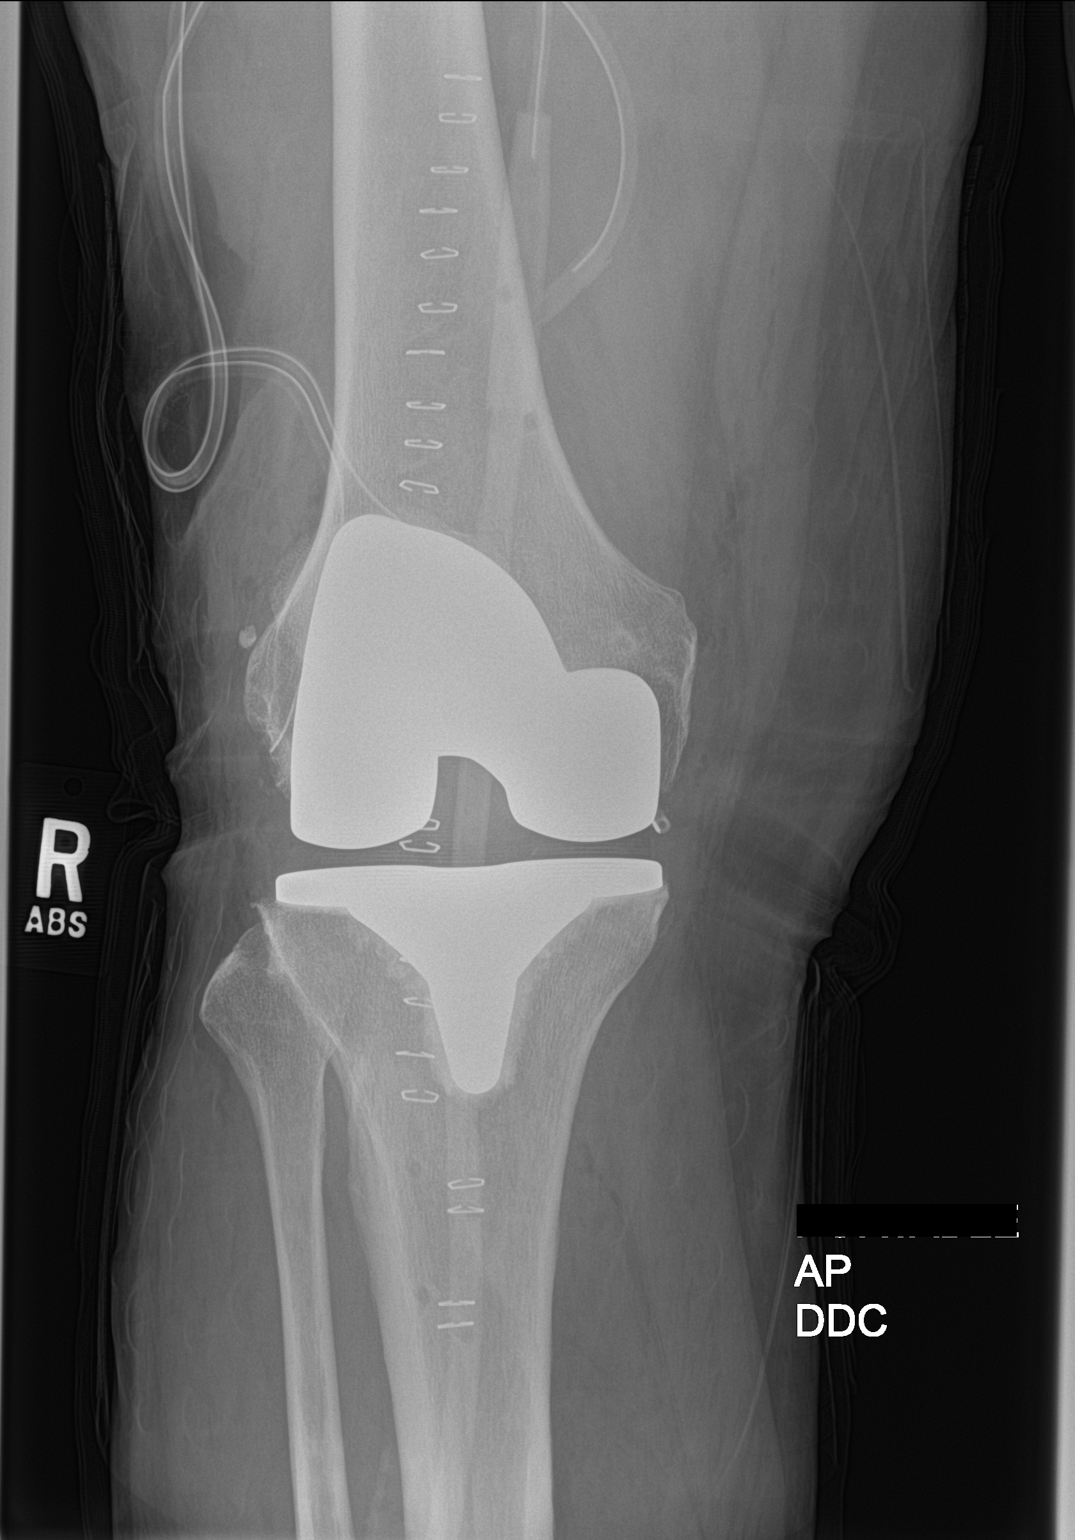

[knee lat]
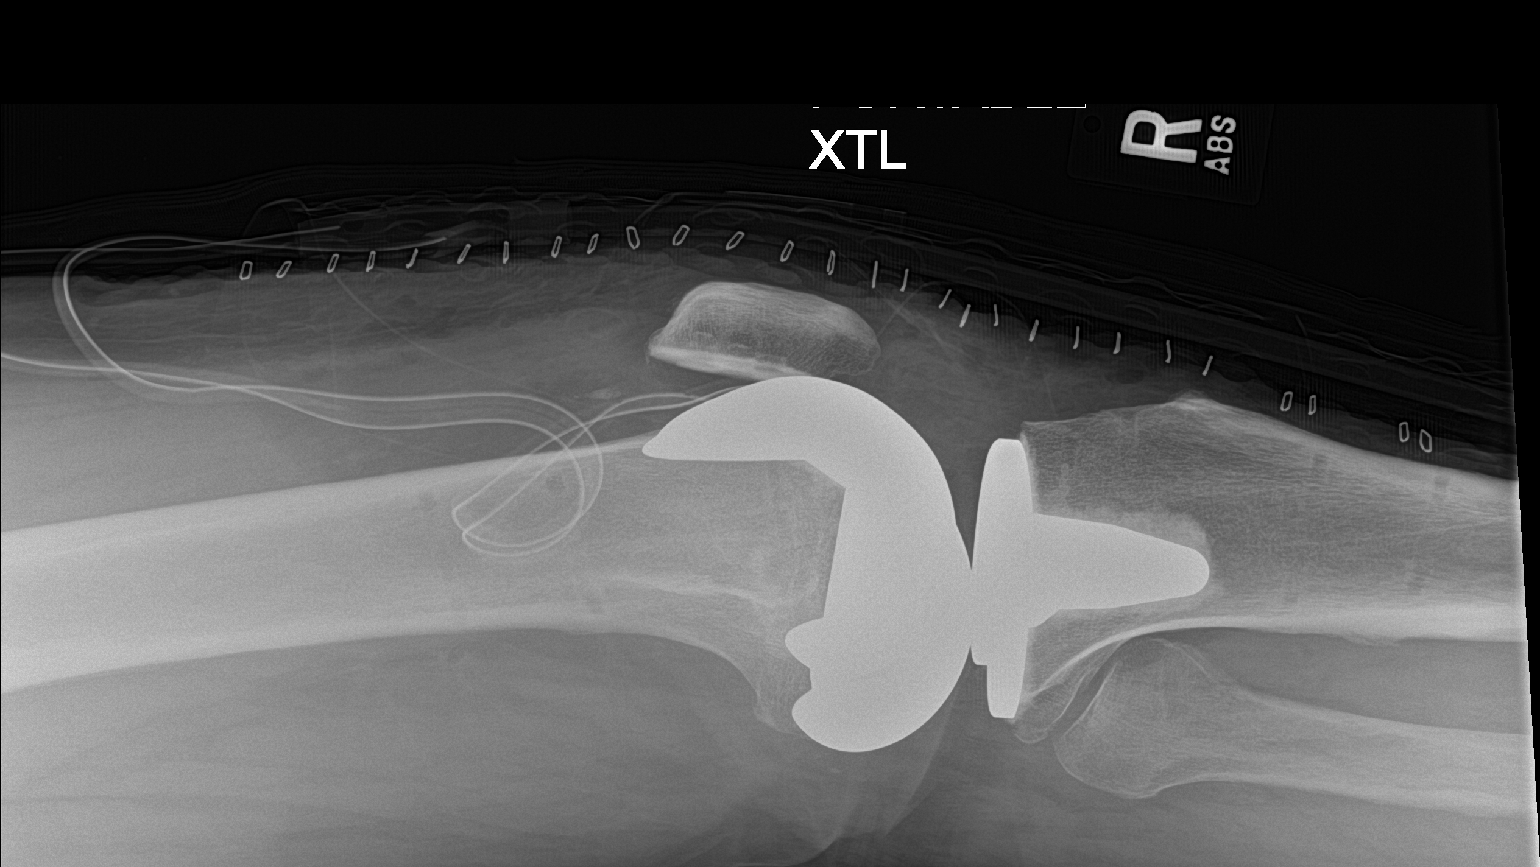

[2 of 2 positions shown; findings below may reference images not displayed]

FINDINGS: Total knee arthroplasty without periprosthetic fracture or
subluxation. Surgical drain and skin staples in place.
IMPRESSION: No unexpected finding after total knee arthroplasty.

## 2017-12-17 DIAGNOSIS — H8110 Benign paroxysmal vertigo, unspecified ear: Secondary | ICD-10-CM | POA: Diagnosis not present

## 2018-03-15 DIAGNOSIS — H2513 Age-related nuclear cataract, bilateral: Secondary | ICD-10-CM | POA: Diagnosis not present

## 2018-03-24 DIAGNOSIS — H2512 Age-related nuclear cataract, left eye: Secondary | ICD-10-CM | POA: Diagnosis not present

## 2018-03-29 ENCOUNTER — Encounter: Payer: Self-pay | Admitting: *Deleted

## 2018-04-06 ENCOUNTER — Encounter
Admission: RE | Disposition: A | Discharge status: Home / Self Care | Payer: Self-pay | Source: Ambulatory Visit | Attending: Ophthalmology

## 2018-04-06 ENCOUNTER — Ambulatory Visit
Admission: RE | Admit: 2018-04-06 | Discharge: 2018-04-06 | Disposition: A | Discharge status: Home / Self Care | Payer: PPO | Source: Ambulatory Visit | Attending: Ophthalmology | Admitting: Ophthalmology

## 2018-04-06 ENCOUNTER — Ambulatory Visit: Payer: PPO | Admitting: Anesthesiology

## 2018-04-06 ENCOUNTER — Encounter: Payer: Self-pay | Admitting: *Deleted

## 2018-04-06 ENCOUNTER — Other Ambulatory Visit: Payer: Self-pay

## 2018-04-06 DIAGNOSIS — K219 Gastro-esophageal reflux disease without esophagitis: Secondary | ICD-10-CM | POA: Diagnosis not present

## 2018-04-06 DIAGNOSIS — I10 Essential (primary) hypertension: Secondary | ICD-10-CM | POA: Diagnosis not present

## 2018-04-06 DIAGNOSIS — H2512 Age-related nuclear cataract, left eye: Secondary | ICD-10-CM | POA: Insufficient documentation

## 2018-04-06 DIAGNOSIS — Z6836 Body mass index (BMI) 36.0-36.9, adult: Secondary | ICD-10-CM | POA: Diagnosis not present

## 2018-04-06 DIAGNOSIS — J45909 Unspecified asthma, uncomplicated: Secondary | ICD-10-CM | POA: Diagnosis not present

## 2018-04-06 DIAGNOSIS — Z791 Long term (current) use of non-steroidal anti-inflammatories (NSAID): Secondary | ICD-10-CM | POA: Insufficient documentation

## 2018-04-06 DIAGNOSIS — Z7982 Long term (current) use of aspirin: Secondary | ICD-10-CM | POA: Diagnosis not present

## 2018-04-06 DIAGNOSIS — J45998 Other asthma: Secondary | ICD-10-CM | POA: Diagnosis not present

## 2018-04-06 DIAGNOSIS — E1136 Type 2 diabetes mellitus with diabetic cataract: Secondary | ICD-10-CM | POA: Insufficient documentation

## 2018-04-06 DIAGNOSIS — Z79899 Other long term (current) drug therapy: Secondary | ICD-10-CM | POA: Insufficient documentation

## 2018-04-06 DIAGNOSIS — E119 Type 2 diabetes mellitus without complications: Secondary | ICD-10-CM | POA: Diagnosis not present

## 2018-04-06 HISTORY — DX: Malignant (primary) neoplasm, unspecified: C80.1

## 2018-04-06 HISTORY — PX: CATARACT EXTRACTION W/PHACO: SHX586

## 2018-04-06 HISTORY — DX: Unspecified hearing loss, unspecified ear: H91.90

## 2018-04-06 HISTORY — DX: Dizziness and giddiness: R42

## 2018-04-06 SURGERY — PHACOEMULSIFICATION, CATARACT, WITH IOL INSERTION
Anesthesia: Monitor Anesthesia Care | Site: Eye | Laterality: Left | Wound class: Clean

## 2018-04-06 MED ORDER — SODIUM HYALURONATE 10 MG/ML IO SOLN
INTRAOCULAR | Status: DC | PRN
  Administered 2018-04-06: 0.55 mL via INTRAOCULAR

## 2018-04-06 MED ORDER — LIDOCAINE HCL (PF) 4 % IJ SOLN
INTRAMUSCULAR | Status: AC
  Filled 2018-04-06: qty 5

## 2018-04-06 MED ORDER — ALFENTANIL 500 MCG/ML IJ INJ
INJECTION | INTRAVENOUS | Status: DC | PRN
  Administered 2018-04-06 (×2): 250 ug via INTRAVENOUS

## 2018-04-06 MED ORDER — MOXIFLOXACIN HCL 0.5 % OP SOLN: 1.0000 [drp] | OPHTHALMIC | Status: DC | PRN

## 2018-04-06 MED ORDER — ARMC OPHTHALMIC DILATING DROPS
1.0000 "application " | OPHTHALMIC | Status: AC
  Administered 2018-04-06 (×3): 1 via OPHTHALMIC

## 2018-04-06 MED ORDER — POVIDONE-IODINE 5 % OP SOLN
OPHTHALMIC | Status: AC
  Filled 2018-04-06: qty 30

## 2018-04-06 MED ORDER — SODIUM CHLORIDE 0.9 % IV SOLN
INTRAVENOUS | Status: DC
  Administered 2018-04-06 (×2): via INTRAVENOUS

## 2018-04-06 MED ORDER — MIDAZOLAM HCL 2 MG/2ML IJ SOLN
INTRAMUSCULAR | Status: AC
  Filled 2018-04-06: qty 2

## 2018-04-06 MED ORDER — MOXIFLOXACIN HCL 0.5 % OP SOLN
OPHTHALMIC | Status: AC
  Filled 2018-04-06: qty 3

## 2018-04-06 MED ORDER — SODIUM HYALURONATE 23 MG/ML IO SOLN
INTRAOCULAR | Status: DC | PRN
  Administered 2018-04-06: 0.6 mL via INTRAOCULAR

## 2018-04-06 MED ORDER — BSS IO SOLN
INTRAOCULAR | Status: DC | PRN
  Administered 2018-04-06: 4 mL via OPHTHALMIC

## 2018-04-06 MED ORDER — POVIDONE-IODINE 5 % OP SOLN
OPHTHALMIC | Status: DC | PRN
  Administered 2018-04-06: 1 via OPHTHALMIC

## 2018-04-06 MED ORDER — SODIUM HYALURONATE 23 MG/ML IO SOLN
INTRAOCULAR | Status: AC
  Filled 2018-04-06: qty 0.6

## 2018-04-06 MED ORDER — MOXIFLOXACIN HCL 0.5 % OP SOLN
OPHTHALMIC | Status: DC | PRN
  Administered 2018-04-06: 0.2 mL via OPHTHALMIC

## 2018-04-06 MED ORDER — EPINEPHRINE PF 1 MG/ML IJ SOLN
INTRAMUSCULAR | Status: AC
  Filled 2018-04-06: qty 2

## 2018-04-06 MED ORDER — ARMC OPHTHALMIC DILATING DROPS
OPHTHALMIC | Status: AC
  Administered 2018-04-06: 1 via OPHTHALMIC
  Filled 2018-04-06: qty 0.4

## 2018-04-06 MED ORDER — MIDAZOLAM HCL 2 MG/2ML IJ SOLN
INTRAMUSCULAR | Status: DC | PRN
  Administered 2018-04-06: 0.5 mg via INTRAVENOUS
  Administered 2018-04-06: 1 mg via INTRAVENOUS
  Administered 2018-04-06: 0.5 mg via INTRAVENOUS

## 2018-04-06 MED ORDER — EPINEPHRINE PF 1 MG/ML IJ SOLN
INTRAOCULAR | Status: DC | PRN
  Administered 2018-04-06: 09:00:00 via OPHTHALMIC

## 2018-04-06 SURGICAL SUPPLY — 16 items
DISSECTOR HYDRO NUCLEUS 50X22 (MISCELLANEOUS) ×2 IMPLANT
GLOVE BIO SURGEON STRL SZ8 (GLOVE) ×2 IMPLANT
GLOVE BIOGEL M 6.5 STRL (GLOVE) ×2 IMPLANT
GLOVE SURG LX 7.5 STRW (GLOVE) ×1
GLOVE SURG LX STRL 7.5 STRW (GLOVE) ×1 IMPLANT
GOWN STRL REUS W/ TWL LRG LVL3 (GOWN DISPOSABLE) ×2 IMPLANT
GOWN STRL REUS W/TWL LRG LVL3 (GOWN DISPOSABLE) ×2
LABEL CATARACT MEDS ST (LABEL) ×2 IMPLANT
LENS IOL TECNIS ITEC 22.5 (Intraocular Lens) ×2 IMPLANT
PACK CATARACT (MISCELLANEOUS) ×2 IMPLANT
PACK CATARACT KING (MISCELLANEOUS) ×2 IMPLANT
PACK EYE AFTER SURG (MISCELLANEOUS) ×2 IMPLANT
SOL BSS BAG (MISCELLANEOUS) ×2
SOLUTION BSS BAG (MISCELLANEOUS) ×1 IMPLANT
WATER STERILE IRR 250ML POUR (IV SOLUTION) ×2 IMPLANT
WIPE NON LINTING 3.25X3.25 (MISCELLANEOUS) ×2 IMPLANT

## 2018-04-06 NOTE — Anesthesia Preprocedure Evaluation (Signed)
Anesthesia Evaluation  Patient identified by MRN, date of birth, ID band Patient awake    Reviewed: Allergy & Precautions, H&P , NPO status , Patient's Chart, lab work & pertinent test results, reviewed documented beta blocker date and time   Airway Mallampati: II  TM Distance: >3 FB Neck ROM: full    Dental no notable dental hx. (+) Teeth Intact   Pulmonary neg pulmonary ROS, asthma ,    Pulmonary exam normal breath sounds clear to auscultation       Cardiovascular Exercise Tolerance: Good hypertension, On Medications negative cardio ROS   Rhythm:regular Rate:Normal     Neuro/Psych negative neurological ROS  negative psych ROS   GI/Hepatic negative GI ROS, Neg liver ROS, GERD  Medicated,  Endo/Other  negative endocrine ROSdiabetes, Well Controlled, Type 2Morbid obesity  Renal/GU      Musculoskeletal   Abdominal   Peds  Hematology negative hematology ROS (+)   Anesthesia Other Findings   Reproductive/Obstetrics negative OB ROS                             Anesthesia Physical Anesthesia Plan  ASA: III  Anesthesia Plan: MAC   Post-op Pain Management:    Induction:   PONV Risk Score and Plan:   Airway Management Planned:   Additional Equipment:   Intra-op Plan:   Post-operative Plan:   Informed Consent: I have reviewed the patients History and Physical, chart, labs and discussed the procedure including the risks, benefits and alternatives for the proposed anesthesia with the patient or authorized representative who has indicated his/her understanding and acceptance.     Plan Discussed with: CRNA  Anesthesia Plan Comments:         Anesthesia Quick Evaluation

## 2018-04-06 NOTE — Op Note (Signed)
OPERATIVE NOTE  Seth Holt [id number] 04/06/2018   PREOPERATIVE DIAGNOSIS:  Nuclear sclerotic cataract left eye.  H25.12   POSTOPERATIVE DIAGNOSIS:    Nuclear sclerotic cataract left eye.     PROCEDURE:  Phacoemusification with posterior chamber intraocular lens placement of the left eye   LENS:   Implant Name Type Inv. Item Serial No. Manufacturer Lot No. LRB No. Used  LENS IOL DIOP 22.5 - [id number] Intraocular Lens LENS IOL DIOP 22.5 [id number] AMO  Left 1       PCB00 +22.5   ULTRASOUND TIME: 0 minutes 19 seconds.  CDE 1.15   SURGEON:  Benay Pillow, MD, MPH   ANESTHESIA:  Topical with tetracaine drops augmented with 1% preservative-free intracameral lidocaine.  ESTIMATED BLOOD LOSS: <1 mL   COMPLICATIONS:  None.   DESCRIPTION OF PROCEDURE:  The patient was identified in the holding room and transported to the operating room and placed in the supine position under the operating microscope.  The left eye was identified as the operative eye and it was prepped and draped in the usual sterile ophthalmic fashion.   A 1.0 millimeter clear-corneal paracentesis was made at the 5:00 position. 0.5 ml of preservative-free 1% lidocaine with epinephrine was injected into the anterior chamber.  The anterior chamber was filled with Healon 5 viscoelastic.  A 2.4 millimeter keratome was used to make a near-clear corneal incision at the 2:00 position.  A curvilinear capsulorrhexis was made with a cystotome and capsulorrhexis forceps.  Balanced salt solution was used to hydrodissect and hydrodelineate the nucleus.   Phacoemulsification was then used in stop and chop fashion to remove the lens nucleus and epinucleus.  The remaining cortex was then removed using the irrigation and aspiration handpiece. Healon was then placed into the capsular bag to distend it for lens placement.  A lens was then injected into the capsular bag.  The remaining viscoelastic was aspirated.   Wounds were  hydrated with balanced salt solution.  The anterior chamber was inflated to a physiologic pressure with balanced salt solution.  Intracameral vigamox 0.1 mL undiltued was injected into the eye and a drop placed onto the ocular surface.  No wound leaks were noted.  The patient was taken to the recovery room in stable condition without complications of anesthesia or surgery  Benay Pillow 04/06/2018, 9:39 AM

## 2018-04-06 NOTE — Anesthesia Postprocedure Evaluation (Signed)
Anesthesia Post Note  Patient: Seth Holt  Procedure(s) Performed: CATARACT EXTRACTION PHACO AND INTRAOCULAR LENS PLACEMENT (IOC) (Left Eye)  Anesthesia Type: MAC Level of consciousness: awake Respiratory status: spontaneous breathing Cardiovascular status: blood pressure returned to baseline Postop Assessment: no headache     Last Vitals:  Vitals:   04/06/18 0809 04/06/18 0942  BP: (!) 146/77 114/60  Pulse: 74 67  Resp:  18  Temp: (!) 36 C 36.6 C  SpO2:  98%    Last Pain:  Vitals:   04/06/18 0809  TempSrc: Temporal  PainSc: 0-No pain                 Bonnita [place]

## 2018-04-06 NOTE — Anesthesia Post-op Follow-up Note (Signed)
Anesthesia QCDR form completed.        

## 2018-04-06 NOTE — Transfer of Care (Signed)
Immediate Anesthesia Transfer of Care Note  Patient: Seth Holt  Procedure(s) Performed: CATARACT EXTRACTION PHACO AND INTRAOCULAR LENS PLACEMENT (IOC) (Left Eye)  Patient Location: PACU and Short Stay  Anesthesia Type:MAC  Level of Consciousness: awake and patient cooperative  Airway & Oxygen Therapy: Patient Spontanous Breathing  Post-op Assessment: Report given to RN and Post -op Vital signs reviewed and stable  Post vital signs: Reviewed and stable  Last Vitals:  Vitals Value Taken Time  BP    Temp    Pulse    Resp    SpO2      Last Pain:  Vitals:   04/06/18 0809  TempSrc: Temporal  PainSc: 0-No pain         Complications: No apparent anesthesia complications

## 2018-04-06 NOTE — Discharge Instructions (Signed)
Eye Surgery Discharge Instructions  Expect mild scratchy sensation or mild soreness. DO NOT RUB YOUR EYE!  The day of surgery:  Minimal physical activity, but bed rest is not required  No reading, computer work, or close hand work  No bending, lifting, or straining.  May watch TV  For 24 hours:  No driving, legal decisions, or alcoholic beverages  Safety precautions  Eat anything you prefer: It is better to start with liquids, then soup then solid foods.  _____ Eye patch should be worn until postoperative exam tomorrow.  ____ Solar shield eyeglasses should be worn for comfort in the sunlight/patch while sleeping  Resume all regular medications including aspirin or Coumadin if these were discontinued prior to surgery. You may shower, bathe, shave, or wash your hair. Tylenol may be taken for mild discomfort.  Call your doctor if you experience significant pain, nausea, or vomiting, fever > 101 or other signs of infection. [phone] or [phone] Specific instructions:  Follow-up Information    Eulogio Bear, MD Follow up on 04/07/2018.   Specialty:  Ophthalmology Why:  10:05 Contact information: [place] [address] [phone]

## 2018-04-06 NOTE — H&P (Signed)
The History and Physical notes are on paper, have been signed, and are to be scanned.   I have examined the patient and there are no changes to the H&P.   Seth Holt 04/06/2018 9:06 AM

## 2018-04-07 ENCOUNTER — Encounter: Payer: Self-pay | Admitting: Ophthalmology

## 2018-04-25 DIAGNOSIS — H2511 Age-related nuclear cataract, right eye: Secondary | ICD-10-CM | POA: Diagnosis not present

## 2018-05-01 ENCOUNTER — Encounter: Payer: Self-pay | Admitting: *Deleted

## 2018-05-04 ENCOUNTER — Ambulatory Visit: Payer: PPO | Admitting: Anesthesiology

## 2018-05-04 ENCOUNTER — Ambulatory Visit
Admission: RE | Admit: 2018-05-04 | Discharge: 2018-05-04 | Disposition: A | Discharge status: Home / Self Care | Payer: PPO | Source: Ambulatory Visit | Attending: Ophthalmology | Admitting: Ophthalmology

## 2018-05-04 ENCOUNTER — Other Ambulatory Visit: Payer: Self-pay

## 2018-05-04 ENCOUNTER — Encounter
Admission: RE | Disposition: A | Discharge status: Home / Self Care | Payer: Self-pay | Source: Ambulatory Visit | Attending: Ophthalmology

## 2018-05-04 DIAGNOSIS — Z7982 Long term (current) use of aspirin: Secondary | ICD-10-CM | POA: Diagnosis not present

## 2018-05-04 DIAGNOSIS — Z86010 Personal history of colonic polyps: Secondary | ICD-10-CM | POA: Insufficient documentation

## 2018-05-04 DIAGNOSIS — Z79899 Other long term (current) drug therapy: Secondary | ICD-10-CM | POA: Diagnosis not present

## 2018-05-04 DIAGNOSIS — Z85828 Personal history of other malignant neoplasm of skin: Secondary | ICD-10-CM | POA: Diagnosis not present

## 2018-05-04 DIAGNOSIS — J45909 Unspecified asthma, uncomplicated: Secondary | ICD-10-CM | POA: Diagnosis not present

## 2018-05-04 DIAGNOSIS — Z9842 Cataract extraction status, left eye: Secondary | ICD-10-CM | POA: Insufficient documentation

## 2018-05-04 DIAGNOSIS — H2511 Age-related nuclear cataract, right eye: Secondary | ICD-10-CM | POA: Insufficient documentation

## 2018-05-04 DIAGNOSIS — Z791 Long term (current) use of non-steroidal anti-inflammatories (NSAID): Secondary | ICD-10-CM | POA: Diagnosis not present

## 2018-05-04 HISTORY — PX: CATARACT EXTRACTION W/PHACO: SHX586

## 2018-05-04 SURGERY — PHACOEMULSIFICATION, CATARACT, WITH IOL INSERTION
Anesthesia: Monitor Anesthesia Care | Laterality: Right

## 2018-05-04 MED ORDER — MIDAZOLAM HCL 2 MG/2ML IJ SOLN
INTRAMUSCULAR | Status: DC | PRN
  Administered 2018-05-04: 2 mg via INTRAVENOUS

## 2018-05-04 MED ORDER — POVIDONE-IODINE 5 % OP SOLN
OPHTHALMIC | Status: AC
  Filled 2018-05-04: qty 30

## 2018-05-04 MED ORDER — FENTANYL CITRATE (PF) 100 MCG/2ML IJ SOLN
INTRAMUSCULAR | Status: DC | PRN
  Administered 2018-05-04 (×2): 50 ug via INTRAVENOUS

## 2018-05-04 MED ORDER — SODIUM HYALURONATE 10 MG/ML IO SOLN
INTRAOCULAR | Status: DC | PRN
  Administered 2018-05-04: 0.55 mL via INTRAOCULAR

## 2018-05-04 MED ORDER — SODIUM CHLORIDE 0.9 % IV SOLN
INTRAVENOUS | Status: DC
  Administered 2018-05-04: 09:00:00 via INTRAVENOUS

## 2018-05-04 MED ORDER — DIPHENHYDRAMINE HCL 50 MG/ML IJ SOLN
INTRAMUSCULAR | Status: AC
  Filled 2018-05-04: qty 1

## 2018-05-04 MED ORDER — MIDAZOLAM HCL 2 MG/2ML IJ SOLN
INTRAMUSCULAR | Status: AC
  Filled 2018-05-04: qty 2

## 2018-05-04 MED ORDER — ARMC OPHTHALMIC DILATING DROPS
1.0000 "application " | OPHTHALMIC | Status: AC
  Administered 2018-05-04 (×3): 1 via OPHTHALMIC

## 2018-05-04 MED ORDER — SODIUM HYALURONATE 23 MG/ML IO SOLN
INTRAOCULAR | Status: AC
  Filled 2018-05-04: qty 0.6

## 2018-05-04 MED ORDER — EPINEPHRINE PF 1 MG/ML IJ SOLN
INTRAMUSCULAR | Status: AC
Start: 2018-05-04 — End: ?
  Filled 2018-05-04: qty 1

## 2018-05-04 MED ORDER — POVIDONE-IODINE 5 % OP SOLN
OPHTHALMIC | Status: DC | PRN
  Administered 2018-05-04: 1 via OPHTHALMIC

## 2018-05-04 MED ORDER — MOXIFLOXACIN HCL 0.5 % OP SOLN
OPHTHALMIC | Status: DC | PRN
  Administered 2018-05-04: 0.2 mL via OPHTHALMIC

## 2018-05-04 MED ORDER — BSS IO SOLN
INTRAOCULAR | Status: DC | PRN
  Administered 2018-05-04: 1 via INTRAOCULAR

## 2018-05-04 MED ORDER — ARMC OPHTHALMIC DILATING DROPS
OPHTHALMIC | Status: AC
  Filled 2018-05-04: qty 0.4

## 2018-05-04 MED ORDER — SODIUM HYALURONATE 23 MG/ML IO SOLN
INTRAOCULAR | Status: DC | PRN
  Administered 2018-05-04: 0.6 mL via INTRAOCULAR

## 2018-05-04 MED ORDER — MOXIFLOXACIN HCL 0.5 % OP SOLN
OPHTHALMIC | Status: AC
  Filled 2018-05-04: qty 3

## 2018-05-04 MED ORDER — FENTANYL CITRATE (PF) 100 MCG/2ML IJ SOLN
INTRAMUSCULAR | Status: AC
  Filled 2018-05-04: qty 2

## 2018-05-04 MED ORDER — LIDOCAINE HCL (PF) 4 % IJ SOLN
INTRAMUSCULAR | Status: AC
Start: 2018-05-04 — End: ?
  Filled 2018-05-04: qty 5

## 2018-05-04 MED ORDER — LIDOCAINE HCL (PF) 4 % IJ SOLN
INTRAOCULAR | Status: DC | PRN
  Administered 2018-05-04: .1 mL via OPHTHALMIC

## 2018-05-04 MED ORDER — MOXIFLOXACIN HCL 0.5 % OP SOLN: 1.0000 [drp] | OPHTHALMIC | Status: DC | PRN

## 2018-05-04 SURGICAL SUPPLY — 18 items
DISSECTOR HYDRO NUCLEUS 50X22 (MISCELLANEOUS) ×3 IMPLANT
GLOVE BIO SURGEON STRL SZ8 (GLOVE) ×3 IMPLANT
GLOVE BIOGEL M 6.5 STRL (GLOVE) ×3 IMPLANT
GLOVE SURG LX 7.5 STRW (GLOVE) ×2
GLOVE SURG LX STRL 7.5 STRW (GLOVE) ×1 IMPLANT
GOWN STRL REUS W/ TWL LRG LVL3 (GOWN DISPOSABLE) ×2 IMPLANT
GOWN STRL REUS W/TWL LRG LVL3 (GOWN DISPOSABLE) ×4
LABEL CATARACT MEDS ST (LABEL) ×3 IMPLANT
LENS IOL TECNIS ITEC 22.0 (Intraocular Lens) ×3 IMPLANT
PACK CATARACT (MISCELLANEOUS) ×3 IMPLANT
PACK CATARACT KING (MISCELLANEOUS) ×3 IMPLANT
PACK EYE AFTER SURG (MISCELLANEOUS) ×3 IMPLANT
SOL BAL SALT 15ML (MISCELLANEOUS) ×3
SOL BSS BAG (MISCELLANEOUS) ×3
SOLUTION BAL SALT 15ML (MISCELLANEOUS) ×1 IMPLANT
SOLUTION BSS BAG (MISCELLANEOUS) ×1 IMPLANT
WATER STERILE IRR 250ML POUR (IV SOLUTION) ×3 IMPLANT
WIPE NON LINTING 3.25X3.25 (MISCELLANEOUS) ×3 IMPLANT

## 2018-05-04 NOTE — Op Note (Signed)
OPERATIVE NOTE  GEOFF DACANAY [id number] 05/04/2018   PREOPERATIVE DIAGNOSIS:  Nuclear sclerotic cataract right eye.  H25.11   POSTOPERATIVE DIAGNOSIS:    Nuclear sclerotic cataract right eye.     PROCEDURE:  Phacoemusification with posterior chamber intraocular lens placement of the right eye   LENS:   Implant Name Type Inv. Item Serial No. Manufacturer Lot No. LRB No. Used  LENS IOL DIOP 22.0 - [id number] Intraocular Lens LENS IOL DIOP 22.0 [id number] AMO  Right 1       PCB00 +22.0   ULTRASOUND TIME: 0 minutes 27 seconds.  CDE 1.98   SURGEON:  Benay Pillow, MD, MPH  ANESTHESIOLOGIST: Anesthesiologist: Gunnar Fusi, MD CRNA: Jonna Clark, CRNA   ANESTHESIA:  Topical with tetracaine drops augmented with 1% preservative-free intracameral lidocaine.  ESTIMATED BLOOD LOSS: less than 1 mL.   COMPLICATIONS:  None.   DESCRIPTION OF PROCEDURE:  The patient was identified in the holding room and transported to the operating room and placed in the supine position under the operating microscope.  The right eye was identified as the operative eye and it was prepped and draped in the usual sterile ophthalmic fashion.   A 1.0 millimeter clear-corneal paracentesis was made at the 10:30 position. 0.5 ml of preservative-free 1% lidocaine with epinephrine was injected into the anterior chamber.  The anterior chamber was filled with Healon 5 viscoelastic.  A 2.4 millimeter keratome was used to make a near-clear corneal incision at the 8:00 position.  A curvilinear capsulorrhexis was made with a cystotome and capsulorrhexis forceps.  Balanced salt solution was used to hydrodissect and hydrodelineate the nucleus.   Phacoemulsification was then used in stop and chop fashion to remove the lens nucleus and epinucleus.  The remaining cortex was then removed using the irrigation and aspiration handpiece. Healon was then placed into the capsular bag to distend it for lens placement.  A  lens was then injected into the capsular bag.  The remaining viscoelastic was aspirated.   Wounds were hydrated with balanced salt solution.  The anterior chamber was inflated to a physiologic pressure with balanced salt solution.   Intracameral vigamox 0.1 mL undiluted was injected into the eye and a drop placed onto the ocular surface.  No wound leaks were noted.  The patient was taken to the recovery room in stable condition without complications of anesthesia or surgery  Benay Pillow 05/04/2018, 11:24 AM

## 2018-05-04 NOTE — Discharge Instructions (Signed)
Eye Surgery Discharge Instructions  Expect mild scratchy sensation or mild soreness. DO NOT RUB YOUR EYE!  The day of surgery:  Minimal physical activity, but bed rest is not required  No reading, computer work, or close hand work  No bending, lifting, or straining.  May watch TV  For 24 hours:  No driving, legal decisions, or alcoholic beverages  Safety precautions  Eat anything you prefer: It is better to start with liquids, then soup then solid foods.  _____ Eye patch should be worn until postoperative exam tomorrow.  ____ Solar shield eyeglasses should be worn for comfort in the sunlight/patch while sleeping  Resume all regular medications including aspirin or Coumadin if these were discontinued prior to surgery. You may shower, bathe, shave, or wash your hair. Tylenol may be taken for mild discomfort.  Call your doctor if you experience significant pain, nausea, or vomiting, fever > 101 or other signs of infection. [phone] or [phone] Specific instructions:  Follow-up Information    Eulogio Bear, MD Follow up.   Specialty:  Ophthalmology Why:  05-05-18 at 8:30  [place] Contact information: [place] [address] [phone]        Eulogio Bear, MD .   Specialty:  Ophthalmology Contact information: [address] [place] [address] [place] [address] [phone]          Eye Surgery Discharge Instructions  Expect mild scratchy sensation or mild soreness. DO NOT RUB YOUR EYE!  The day of surgery:  Minimal physical activity, but bed rest is not required  No reading, computer work, or close hand work  No bending, lifting, or straining.  May watch TV  For 24 hours:  No driving, legal decisions, or alcoholic beverages  Safety precautions  Eat anything you prefer: It is better to start with liquids, then soup then solid foods.  _____ Eye patch should be worn until postoperative exam tomorrow.  ____ Solar  shield eyeglasses should be worn for comfort in the sunlight/patch while sleeping  Resume all regular medications including aspirin or Coumadin if these were discontinued prior to surgery. You may shower, bathe, shave, or wash your hair. Tylenol may be taken for mild discomfort.  Call your doctor if you experience significant pain, nausea, or vomiting, fever > 101 or other signs of infection. [phone] or [phone] Specific instructions:  Follow-up Information    Eulogio Bear, MD Follow up.   Specialty:  Ophthalmology Why:  05-05-18 at 8:30  [place] Contact information: [address] [place] [address] [phone]        Eulogio Bear, MD .   Specialty:  Ophthalmology Contact information: [place] [address] [phone]

## 2018-05-04 NOTE — H&P (Signed)
The History and Physical notes are on paper, have been signed, and are to be scanned.   I have examined the patient and there are no changes to the H&P.   Benay Pillow 05/04/2018 10:54 AM

## 2018-05-04 NOTE — Anesthesia Preprocedure Evaluation (Signed)
Anesthesia Evaluation  Patient identified by MRN, date of birth, ID band Patient awake    Reviewed: Allergy & Precautions, NPO status , Patient's Chart, lab work & pertinent test results  History of Anesthesia Complications Negative for: history of anesthetic complications  Airway Mallampati: II       Dental   Pulmonary asthma (allergic, no inhalers) , neg sleep apnea, neg COPD,           Cardiovascular (-) hypertension(-) Past MI and (-) CHF (-) dysrhythmias (-) Valvular Problems/Murmurs     Neuro/Psych neg Seizures    GI/Hepatic Neg liver ROS, GERD (occassional, tx with tums)  ,  Endo/Other  neg diabetes  Renal/GU negative Renal ROS     Musculoskeletal   Abdominal   Peds  Hematology   Anesthesia Other Findings   Reproductive/Obstetrics                             Anesthesia Physical Anesthesia Plan  ASA: II  Anesthesia Plan: MAC   Post-op Pain Management:    Induction:   PONV Risk Score and Plan:   Airway Management Planned:   Additional Equipment:   Intra-op Plan:   Post-operative Plan:   Informed Consent: I have reviewed the patients History and Physical, chart, labs and discussed the procedure including the risks, benefits and alternatives for the proposed anesthesia with the patient or authorized representative who has indicated his/her understanding and acceptance.     Plan Discussed with:   Anesthesia Plan Comments:         Anesthesia Quick Evaluation

## 2018-05-04 NOTE — Anesthesia Post-op Follow-up Note (Signed)
Anesthesia QCDR form completed.        

## 2018-05-04 NOTE — Anesthesia Postprocedure Evaluation (Signed)
Anesthesia Post Note  Patient: Seth Holt  Procedure(s) Performed: CATARACT EXTRACTION PHACO AND INTRAOCULAR LENS PLACEMENT (IOC) (Right )  Patient location during evaluation: Short Stay Anesthesia Type: MAC Level of consciousness: awake and alert, awake and oriented Pain management: pain level controlled Vital Signs Assessment: post-procedure vital signs reviewed and stable Respiratory status: spontaneous breathing Cardiovascular status: stable Postop Assessment: no headache Anesthetic complications: no     Last Vitals:  Vitals:   05/04/18 1126 05/04/18 1133  BP: (!) 124/94 124/74  Pulse: 70 67  Resp: 16   Temp: (!) 36 C   SpO2: 98% 98%    Last Pain:  Vitals:   05/04/18 1133  TempSrc:   PainSc: 0-No pain                 Lanora Manis

## 2018-05-04 NOTE — Transfer of Care (Signed)
Immediate Anesthesia Transfer of Care Note  Patient: Seth Holt  Procedure(s) Performed: CATARACT EXTRACTION PHACO AND INTRAOCULAR LENS PLACEMENT (IOC) (Right )  Patient Location: Short Stay  Anesthesia Type:MAC  Level of Consciousness: awake, alert  and oriented  Airway & Oxygen Therapy: Patient Spontanous Breathing  Post-op Assessment: Report given to RN and Post -op Vital signs reviewed and stable  Post vital signs: Reviewed and stable  Last Vitals:  Vitals Value Taken Time  BP 124/94 05/04/2018 11:26 AM  Temp 36 C 05/04/2018 11:26 AM  Pulse 70 05/04/2018 11:26 AM  Resp 16 05/04/2018 11:26 AM  SpO2 98 % 05/04/2018 11:26 AM    Last Pain:  Vitals:   05/04/18 1126  TempSrc: Tympanic  PainSc:          Complications: No apparent anesthesia complications

## 2018-05-05 ENCOUNTER — Encounter: Payer: Self-pay | Admitting: Ophthalmology

## 2018-08-03 DIAGNOSIS — L814 Other melanin hyperpigmentation: Secondary | ICD-10-CM | POA: Diagnosis not present

## 2018-08-03 DIAGNOSIS — L570 Actinic keratosis: Secondary | ICD-10-CM | POA: Diagnosis not present

## 2018-08-03 DIAGNOSIS — C4441 Basal cell carcinoma of skin of scalp and neck: Secondary | ICD-10-CM | POA: Diagnosis not present

## 2018-08-03 DIAGNOSIS — L578 Other skin changes due to chronic exposure to nonionizing radiation: Secondary | ICD-10-CM | POA: Diagnosis not present

## 2018-08-03 DIAGNOSIS — D485 Neoplasm of uncertain behavior of skin: Secondary | ICD-10-CM | POA: Diagnosis not present

## 2018-09-13 DIAGNOSIS — Z13220 Encounter for screening for lipoid disorders: Secondary | ICD-10-CM | POA: Diagnosis not present

## 2018-09-13 DIAGNOSIS — Z1159 Encounter for screening for other viral diseases: Secondary | ICD-10-CM | POA: Diagnosis not present

## 2018-09-13 DIAGNOSIS — Z0000 Encounter for general adult medical examination without abnormal findings: Secondary | ICD-10-CM | POA: Diagnosis not present

## 2018-09-13 DIAGNOSIS — Z1389 Encounter for screening for other disorder: Secondary | ICD-10-CM | POA: Diagnosis not present

## 2018-09-13 DIAGNOSIS — Z131 Encounter for screening for diabetes mellitus: Secondary | ICD-10-CM | POA: Diagnosis not present

## 2018-09-13 DIAGNOSIS — Z23 Encounter for immunization: Secondary | ICD-10-CM | POA: Diagnosis not present

## 2018-09-13 DIAGNOSIS — Z130 Encounter for screening for diseases of the blood and blood-forming organs and certain disorders involving the immune mechanism: Secondary | ICD-10-CM | POA: Diagnosis not present

## 2019-03-05 DIAGNOSIS — E669 Obesity, unspecified: Secondary | ICD-10-CM | POA: Diagnosis not present

## 2019-03-05 DIAGNOSIS — M1990 Unspecified osteoarthritis, unspecified site: Secondary | ICD-10-CM | POA: Diagnosis not present

## 2019-04-02 DIAGNOSIS — E669 Obesity, unspecified: Secondary | ICD-10-CM | POA: Diagnosis not present

## 2019-04-02 DIAGNOSIS — M1990 Unspecified osteoarthritis, unspecified site: Secondary | ICD-10-CM | POA: Diagnosis not present

## 2019-04-04 DIAGNOSIS — J45909 Unspecified asthma, uncomplicated: Secondary | ICD-10-CM | POA: Diagnosis not present

## 2019-04-04 DIAGNOSIS — Z800 Family history of malignant neoplasm of digestive organs: Secondary | ICD-10-CM | POA: Diagnosis not present

## 2019-04-04 DIAGNOSIS — Z86010 Personal history of colonic polyps: Secondary | ICD-10-CM | POA: Diagnosis not present

## 2019-04-04 DIAGNOSIS — Z96651 Presence of right artificial knee joint: Secondary | ICD-10-CM | POA: Diagnosis not present

## 2019-08-17 ENCOUNTER — Other Ambulatory Visit: Payer: Self-pay

## 2019-08-17 ENCOUNTER — Encounter
Admission: RE | Admit: 2019-08-17 | Discharge: 2019-08-17 | Disposition: A | Discharge status: Home / Self Care | Payer: PPO | Source: Ambulatory Visit | Attending: Internal Medicine | Admitting: Internal Medicine

## 2019-08-17 DIAGNOSIS — Z01812 Encounter for preprocedural laboratory examination: Secondary | ICD-10-CM | POA: Insufficient documentation

## 2019-08-17 DIAGNOSIS — Z20828 Contact with and (suspected) exposure to other viral communicable diseases: Secondary | ICD-10-CM | POA: Insufficient documentation

## 2019-08-17 LAB — SARS CORONAVIRUS 2 (TAT 6-24 HRS): SARS Coronavirus 2: NEGATIVE

## 2019-08-22 ENCOUNTER — Ambulatory Visit
Admission: RE | Admit: 2019-08-22 | Discharge: 2019-08-22 | Disposition: A | Discharge status: Home / Self Care | Payer: PPO | Attending: Internal Medicine | Admitting: Internal Medicine

## 2019-08-22 ENCOUNTER — Encounter
Admission: RE | Disposition: A | Discharge status: Home / Self Care | Payer: Self-pay | Source: Home / Self Care | Attending: Internal Medicine

## 2019-08-22 ENCOUNTER — Encounter: Payer: Self-pay | Admitting: *Deleted

## 2019-08-22 ENCOUNTER — Ambulatory Visit: Payer: PPO | Admitting: Anesthesiology

## 2019-08-22 ENCOUNTER — Other Ambulatory Visit: Payer: Self-pay

## 2019-08-22 DIAGNOSIS — K640 First degree hemorrhoids: Secondary | ICD-10-CM | POA: Insufficient documentation

## 2019-08-22 DIAGNOSIS — Z800 Family history of malignant neoplasm of digestive organs: Secondary | ICD-10-CM | POA: Diagnosis not present

## 2019-08-22 DIAGNOSIS — J45909 Unspecified asthma, uncomplicated: Secondary | ICD-10-CM | POA: Insufficient documentation

## 2019-08-22 DIAGNOSIS — Z8719 Personal history of other diseases of the digestive system: Secondary | ICD-10-CM | POA: Diagnosis not present

## 2019-08-22 DIAGNOSIS — Z1211 Encounter for screening for malignant neoplasm of colon: Secondary | ICD-10-CM | POA: Diagnosis not present

## 2019-08-22 DIAGNOSIS — Z96652 Presence of left artificial knee joint: Secondary | ICD-10-CM | POA: Insufficient documentation

## 2019-08-22 DIAGNOSIS — Z791 Long term (current) use of non-steroidal anti-inflammatories (NSAID): Secondary | ICD-10-CM | POA: Insufficient documentation

## 2019-08-22 DIAGNOSIS — Z7982 Long term (current) use of aspirin: Secondary | ICD-10-CM | POA: Insufficient documentation

## 2019-08-22 DIAGNOSIS — K219 Gastro-esophageal reflux disease without esophagitis: Secondary | ICD-10-CM | POA: Diagnosis not present

## 2019-08-22 DIAGNOSIS — Z79899 Other long term (current) drug therapy: Secondary | ICD-10-CM | POA: Diagnosis not present

## 2019-08-22 DIAGNOSIS — K649 Unspecified hemorrhoids: Secondary | ICD-10-CM | POA: Diagnosis not present

## 2019-08-22 DIAGNOSIS — I10 Essential (primary) hypertension: Secondary | ICD-10-CM | POA: Diagnosis not present

## 2019-08-22 DIAGNOSIS — Z86010 Personal history of colonic polyps: Secondary | ICD-10-CM | POA: Diagnosis not present

## 2019-08-22 HISTORY — PX: COLONOSCOPY WITH PROPOFOL: SHX5780

## 2019-08-22 SURGERY — COLONOSCOPY WITH PROPOFOL
Anesthesia: General

## 2019-08-22 MED ORDER — PROPOFOL 500 MG/50ML IV EMUL
INTRAVENOUS | Status: DC | PRN
  Administered 2019-08-22: 130 ug/kg/min via INTRAVENOUS

## 2019-08-22 MED ORDER — SODIUM CHLORIDE 0.9 % IV SOLN
INTRAVENOUS | Status: DC
  Administered 2019-08-22: 1000 mL via INTRAVENOUS

## 2019-08-22 MED ORDER — LIDOCAINE HCL (CARDIAC) PF 100 MG/5ML IV SOSY
PREFILLED_SYRINGE | INTRAVENOUS | Status: DC | PRN
  Administered 2019-08-22: 40 mg via INTRAVENOUS

## 2019-08-22 MED ORDER — PROPOFOL 10 MG/ML IV BOLUS
INTRAVENOUS | Status: DC | PRN
  Administered 2019-08-22: 80 mg via INTRAVENOUS

## 2019-08-22 NOTE — Transfer of Care (Signed)
Immediate Anesthesia Transfer of Care Note  Patient: Seth Holt  Procedure(s) Performed: COLONOSCOPY WITH PROPOFOL (N/A )  Patient Location: PACU and Endoscopy Unit  Anesthesia Type:General  Level of Consciousness: drowsy  Airway & Oxygen Therapy: Patient Spontanous Breathing  Post-op Assessment: Report given to RN and Post -op Vital signs reviewed and stable  Post vital signs: Reviewed and stable  Last Vitals:  Vitals Value Taken Time  BP 83/49 08/22/19 1410  Temp 36.4 C 08/22/19 1408  Pulse 64 08/22/19 1410  Resp 11 08/22/19 1410  SpO2 96 % 08/22/19 1410  Vitals shown include unvalidated device data.  Last Pain:  Vitals:   08/22/19 1408  TempSrc: Tympanic  PainSc: Asleep         Complications: No apparent anesthesia complications

## 2019-08-22 NOTE — Interval H&P Note (Signed)
History and Physical Interval Note:  08/22/2019 1:37 PM  Seth Holt  has presented today for surgery, with the diagnosis of PERSONAL HX.OF COLON POLYPS.  The various methods of treatment have been discussed with the patient and family. After consideration of risks, benefits and other options for treatment, the patient has consented to  Procedure(s): COLONOSCOPY WITH PROPOFOL (N/A) as a surgical intervention.  The patient's history has been reviewed, patient examined, no change in status, stable for surgery.  I have reviewed the patient's chart and labs.  Questions were answered to the patient's satisfaction.     [place], [place]

## 2019-08-22 NOTE — Anesthesia Preprocedure Evaluation (Signed)
Anesthesia Evaluation  Patient identified by MRN, date of birth, ID band Patient awake    Reviewed: Allergy & Precautions, NPO status , Patient's Chart, lab work & pertinent test results  History of Anesthesia Complications Negative for: history of anesthetic complications  Airway Mallampati: II  TM Distance: >3 FB Neck ROM: limited    Dental  (+) Chipped   Pulmonary asthma (allergic, no inhalers) , neg sleep apnea, neg COPD, Not current smoker,           Cardiovascular Exercise Tolerance: Good (-) hypertension(-) Past MI and (-) CHF (-) dysrhythmias (-) Valvular Problems/Murmurs     Neuro/Psych neg Seizures    GI/Hepatic Neg liver ROS, GERD (occassional, tx with tums)  ,  Endo/Other  neg diabetes  Renal/GU negative Renal ROS     Musculoskeletal   Abdominal   Peds  Hematology   Anesthesia Other Findings Past Medical History: No date: Asthma     Comment:  enviromental allergies No date: Cancer ([place]) No date: GERD (gastroesophageal reflux disease) No date: HOH (hard of hearing)     Comment:  AIDS No date: Vertigo  Past Surgical History: 04/06/2018: CATARACT EXTRACTION W/PHACO; Left     Comment:  Procedure: CATARACT EXTRACTION PHACO AND INTRAOCULAR               LENS PLACEMENT (IOC);  Surgeon: Eulogio Bear, MD;                Location: [facility] ORS;  Service: Ophthalmology;  Laterality:              Left;  [place] 00:19.8 AP% 5.8 CDE 1.15 Fluid pack lot #               [id number] 05/04/2018: CATARACT EXTRACTION W/PHACO; Right     Comment:  Procedure: CATARACT EXTRACTION PHACO AND INTRAOCULAR               LENS PLACEMENT (IOC);  Surgeon: Eulogio Bear, MD;                Location: [facility] ORS;  Service: Ophthalmology;  Laterality:              Right;  fluid pack lot # [id number]  exp 12/29/2018 [place]               00:27.7 AP% 7.1 CDE 1.98 No date: EYE SURGERY No date: JOINT REPLACEMENT     Comment:  TKR 08/18/2016:  KNEE ARTHROPLASTY; Right     Comment:  Procedure: COMPUTER ASSISTED TOTAL KNEE ARTHROPLASTY;                Surgeon: Dereck Leep, MD;  Location: [facility] ORS;                Service: Orthopedics;  Laterality: Right; No date: LACERATION REPAIR; Left     Comment:  thumb   Reproductive/Obstetrics                             Anesthesia Physical  Anesthesia Plan  ASA: II  Anesthesia Plan: General   Post-op Pain Management:    Induction: Intravenous  PONV Risk Score and Plan: Propofol infusion and TIVA  Airway Management Planned: Natural Airway and Nasal Cannula  Additional Equipment:   Intra-op Plan:   Post-operative Plan:   Informed Consent: I have reviewed the patients History and Physical, chart, labs and discussed the procedure including the risks, benefits and  alternatives for the proposed anesthesia with the patient or authorized representative who has indicated his/her understanding and acceptance.     Dental Advisory Given  Plan Discussed with: Anesthesiologist, CRNA and Surgeon  Anesthesia Plan Comments: (Patient consented for risks of anesthesia including but not limited to:  - adverse reactions to medications - risk of intubation if required - damage to teeth, lips or other oral mucosa - sore throat or hoarseness - Damage to heart, brain, lungs or loss of life  Patient voiced understanding.)        Anesthesia Quick Evaluation

## 2019-08-22 NOTE — Op Note (Signed)
[facility] Gastroenterology Patient Name: Seth Holt Procedure Date: 08/22/2019 1:38 PM MRN: [id number] Account #: [id number] Date of Birth: 1945-12-12 Admit Type: Outpatient Age: 73 Room: [facility] ENDO ROOM 3 Gender: Male Note Status: Finalized Procedure:            Colonoscopy Indications:          Surveillance: Personal history of adenomatous polyps on                        last colonoscopy > 5 years ago Providers:            Lorie Apley K. Alice Reichert MD, MD Referring MD:         Lorin Mercy. Hamrick (Referring MD) Medicines:            Propofol per Anesthesia Complications:        No immediate complications. Procedure:            Pre-Anesthesia Assessment:                       - The risks and benefits of the procedure and the                        sedation options and risks were discussed with the                        patient. All questions were answered and informed                        consent was obtained.                       - Patient identification and proposed procedure were                        verified prior to the procedure by the nurse. The                        procedure was verified in the procedure room.                       - ASA Grade Assessment: III - A patient with severe                        systemic disease.                       - After reviewing the risks and benefits, the patient                        was deemed in satisfactory condition to undergo the                        procedure.                       After obtaining informed consent, the colonoscope was                        passed under direct vision. Throughout the procedure,  the patient's blood pressure, pulse, and oxygen                        saturations were monitored continuously. The                        Colonoscope was introduced through the anus and                        advanced to the the cecum, identified by appendiceal           orifice and ileocecal valve. The colonoscopy was                        performed without difficulty. The patient tolerated the                        procedure well. The quality of the bowel preparation                        was good. The ileocecal valve, appendiceal orifice, and                        rectum were photographed. Findings:      The perianal and digital rectal examinations were normal. Pertinent       negatives include normal sphincter tone and no palpable rectal lesions.      The colon (entire examined portion) appeared normal.      Non-bleeding internal hemorrhoids were found during retroflexion. The       hemorrhoids were Grade I (internal hemorrhoids that do not prolapse).      The exam was otherwise without abnormality. Impression:           - The entire examined colon is normal.                       - Non-bleeding internal hemorrhoids.                       - The examination was otherwise normal.                       - No specimens collected. Recommendation:       - Patient has a contact number available for                        emergencies. The signs and symptoms of potential                        delayed complications were discussed with the patient.                        Return to normal activities tomorrow. Written discharge                        instructions were provided to the patient.                       - Resume previous diet.                       - Continue present medications.                       -  Repeat colonoscopy in 5 years for surveillance.                       - Return to GI office PRN.                       - The findings and recommendations were discussed with                        the patient. Procedure Code(s):    --- Professional ---                       [id number], Colorectal cancer screening; colonoscopy on                        individual at high risk Diagnosis Code(s):    --- Professional ---                        K64.0, First degree hemorrhoids                       Z86.010, Personal history of colonic polyps CPT copyright 2019 American Medical Association. All rights reserved. The codes documented in this report are preliminary and upon coder review may  be revised to meet current compliance requirements. Efrain Sella MD, MD 08/22/2019 2:09:47 PM This report has been signed electronically. Number of Addenda: 0 Note Initiated On: 08/22/2019 1:38 PM Scope Withdrawal Time: 0 hours 7 minutes 28 seconds  Total Procedure Duration: 0 hours 15 minutes 4 seconds  Estimated Blood Loss: Estimated blood loss: none.      [facility]

## 2019-08-22 NOTE — Anesthesia Post-op Follow-up Note (Signed)
Anesthesia QCDR form completed.        

## 2019-08-22 NOTE — H&P (Signed)
Outpatient short stay form Pre-procedure 08/22/2019 1:36 PM Seth Holt, M.D.  Primary Physician: [name], M.D.  Reason for visit:  Personal hx of adenomatous colon polyps (2015), Family hx of colon cancer - Father.  History of present illness:                            Patient presents for colonoscopy for a personal hx of colon polyps. The patient denies abdominal pain, abnormal weight loss or rectal bleeding.73 year old patient presenting for family history of colon cancer. Patient denies any change in bowel habits, rectal bleeding or involuntary weight loss.    Current Facility-Administered Medications:  .  0.9 %  sodium chloride infusion, , Intravenous, Continuous, [place], Benay Pike, MD, Last Rate: 20 mL/hr at 08/22/19 1311, 1,000 mL at 08/22/19 1311  Medications Prior to Admission  Medication Sig Dispense Refill Last Dose  . acetaminophen (TYLENOL) 500 MG chewable tablet Chew 1,000 mg by mouth every 6 (six) hours as needed for pain.    Past Week at Unknown time  . aspirin 325 MG tablet Take 325 mg by mouth daily.   Past Week at Unknown time  . calcium carbonate (TUMS EX) 750 MG chewable tablet Chew 1-2 tablets by mouth daily as needed for heartburn.    Past Week at Unknown time  . chlorpheniramine (CHLORPHEN) 4 MG tablet Take 4 mg by mouth 2 (two) times daily as needed for allergies.   08/22/2019 at 1215  . diphenhydrAMINE (BENADRYL) 25 MG tablet Take 25 mg by mouth at bedtime.   Past Week at Unknown time  . glucosamine-chondroitin 500-400 MG tablet Take 1 tablet by mouth 3 (three) times daily.   Past Week at Unknown time  . Hyaluronic Acid-Vitamin C (HYALURONIC ACID PO) Take 400 mg by mouth daily. 200 mg per cap   Past Week at Unknown time  . Multiple Vitamin (MULTIVITAMIN) capsule Take 1 capsule by mouth daily.   Past Week at Unknown time  . naproxen (NAPROSYN) 375 MG tablet Take 375 mg by mouth 2 (two) times daily as needed.   Past Week at Unknown time  . omega-3 acid  ethyl esters (LOVAZA) 1 g capsule Take 1 g by mouth daily.   Past Week at Unknown time  . PRESCRIPTION MEDICATION Place 1 drop into the left eye 2 (two) times daily. Prednisolone 10mg  gatifloxacin 5mg  bromfenac 0.75mg    Past Week at Unknown time     No Known Allergies   Past Medical History:  Diagnosis Date  . Asthma    enviromental allergies  . Cancer ([place])   . GERD (gastroesophageal reflux disease)   . HOH (hard of hearing)    AIDS  . Vertigo     Review of systems:  Otherwise negative.    Physical Exam  Gen: Alert, oriented. Appears stated age.  HEENT: [place]/AT. PERRLA. Lungs: CTA, no wheezes. CV: RR nl S1, S2. Abd: soft, benign, no masses. BS+ Ext: No edema. Pulses 2+    Planned procedures: Proceed with colonoscopy. The patient understands the nature of the planned procedure, indications, risks, alternatives and potential complications including but not limited to bleeding, infection, perforation, damage to internal organs and possible oversedation/side effects from anesthesia. The patient agrees and gives consent to proceed.  Please refer to procedure notes for findings, recommendations and patient disposition/instructions.     Seth Holt, M.D. Gastroenterology 08/22/2019  1:36 PM

## 2019-08-23 ENCOUNTER — Encounter: Payer: Self-pay | Admitting: Internal Medicine

## 2019-08-23 NOTE — Anesthesia Postprocedure Evaluation (Signed)
Anesthesia Post Note  Patient: Seth Holt  Procedure(s) Performed: COLONOSCOPY WITH PROPOFOL (N/A )  Patient location during evaluation: Endoscopy Anesthesia Type: General Level of consciousness: awake and alert Pain management: pain level controlled Vital Signs Assessment: post-procedure vital signs reviewed and stable Respiratory status: spontaneous breathing, nonlabored ventilation, respiratory function stable and patient connected to nasal cannula oxygen Cardiovascular status: blood pressure returned to baseline and stable Postop Assessment: no apparent nausea or vomiting Anesthetic complications: no     Last Vitals:  Vitals:   08/22/19 1428 08/22/19 1438  BP: 98/77 110/83  Resp: 16   Temp:      Last Pain:  Vitals:   08/22/19 1438  TempSrc:   PainSc: 0-No pain                 Precious Haws Romina Divirgilio

## 2019-10-08 DIAGNOSIS — Z131 Encounter for screening for diabetes mellitus: Secondary | ICD-10-CM | POA: Diagnosis not present

## 2019-10-08 DIAGNOSIS — R030 Elevated blood-pressure reading, without diagnosis of hypertension: Secondary | ICD-10-CM | POA: Diagnosis not present

## 2019-10-08 DIAGNOSIS — Z13220 Encounter for screening for lipoid disorders: Secondary | ICD-10-CM | POA: Diagnosis not present

## 2019-10-08 DIAGNOSIS — Z0000 Encounter for general adult medical examination without abnormal findings: Secondary | ICD-10-CM | POA: Diagnosis not present

## 2019-10-08 DIAGNOSIS — M1990 Unspecified osteoarthritis, unspecified site: Secondary | ICD-10-CM | POA: Diagnosis not present

## 2020-01-15 DIAGNOSIS — L821 Other seborrheic keratosis: Secondary | ICD-10-CM | POA: Diagnosis not present

## 2020-01-15 DIAGNOSIS — D1801 Hemangioma of skin and subcutaneous tissue: Secondary | ICD-10-CM | POA: Diagnosis not present

## 2020-01-15 DIAGNOSIS — L578 Other skin changes due to chronic exposure to nonionizing radiation: Secondary | ICD-10-CM | POA: Diagnosis not present

## 2020-01-15 DIAGNOSIS — Q828 Other specified congenital malformations of skin: Secondary | ICD-10-CM | POA: Diagnosis not present

## 2020-01-15 DIAGNOSIS — Z85828 Personal history of other malignant neoplasm of skin: Secondary | ICD-10-CM | POA: Diagnosis not present

## 2020-09-23 DIAGNOSIS — M1712 Unilateral primary osteoarthritis, left knee: Secondary | ICD-10-CM | POA: Diagnosis not present

## 2020-09-23 DIAGNOSIS — M25562 Pain in left knee: Secondary | ICD-10-CM | POA: Diagnosis not present

## 2020-12-04 ENCOUNTER — Other Ambulatory Visit: Payer: Self-pay | Admitting: Ophthalmology

## 2020-12-04 DIAGNOSIS — G453 Amaurosis fugax: Secondary | ICD-10-CM | POA: Diagnosis not present

## 2020-12-09 ENCOUNTER — Other Ambulatory Visit: Payer: Self-pay

## 2020-12-09 ENCOUNTER — Ambulatory Visit
Admission: RE | Admit: 2020-12-09 | Discharge: 2020-12-09 | Disposition: A | Discharge status: Home / Self Care | Payer: PPO | Source: Ambulatory Visit | Attending: Ophthalmology | Admitting: Ophthalmology

## 2020-12-09 DIAGNOSIS — I6523 Occlusion and stenosis of bilateral carotid arteries: Secondary | ICD-10-CM | POA: Diagnosis not present

## 2020-12-09 DIAGNOSIS — G453 Amaurosis fugax: Secondary | ICD-10-CM | POA: Diagnosis not present

## 2020-12-09 DIAGNOSIS — Z8673 Personal history of transient ischemic attack (TIA), and cerebral infarction without residual deficits: Secondary | ICD-10-CM | POA: Diagnosis not present

## 2020-12-16 ENCOUNTER — Ambulatory Visit (INDEPENDENT_AMBULATORY_CARE_PROVIDER_SITE_OTHER): Payer: PPO | Admitting: Vascular Surgery

## 2020-12-16 ENCOUNTER — Other Ambulatory Visit: Payer: Self-pay

## 2020-12-16 DIAGNOSIS — K635 Polyp of colon: Secondary | ICD-10-CM | POA: Insufficient documentation

## 2020-12-16 DIAGNOSIS — G453 Amaurosis fugax: Secondary | ICD-10-CM | POA: Insufficient documentation

## 2020-12-16 DIAGNOSIS — I6529 Occlusion and stenosis of unspecified carotid artery: Secondary | ICD-10-CM | POA: Insufficient documentation

## 2020-12-16 DIAGNOSIS — I6523 Occlusion and stenosis of bilateral carotid arteries: Secondary | ICD-10-CM | POA: Diagnosis not present

## 2020-12-16 DIAGNOSIS — E785 Hyperlipidemia, unspecified: Secondary | ICD-10-CM

## 2020-12-16 DIAGNOSIS — J45909 Unspecified asthma, uncomplicated: Secondary | ICD-10-CM | POA: Insufficient documentation

## 2020-12-16 MED ORDER — ATORVASTATIN CALCIUM 10 MG PO TABS: 10.0000 mg | ORAL_TABLET | Freq: Every day | ORAL | 11 refills | Status: DC

## 2020-12-16 MED ORDER — CLOPIDOGREL BISULFATE 75 MG PO TABS: 75.0000 mg | ORAL_TABLET | Freq: Every day | ORAL | 6 refills | Status: DC

## 2020-12-16 NOTE — Patient Instructions (Signed)
Carotid Artery Disease  Carotid artery disease is the narrowing or blockage of one or both carotid arteries. This condition is also called carotid artery stenosis. The carotid arteries are the two main blood vessels on either side of the neck. They send blood to the brain, other parts of the head, and the neck.  This condition increases your risk for a stroke or a transient ischemic attack (TIA). A TIA is a "mini-stroke" that causes stroke-like symptoms that go away quickly. What are the causes? This condition is mainly caused by a narrowing and hardening of the carotid arteries. The carotid arteries can become narrow or clogged with a buildup of plaque. Plaque includes:  Fat.  Cholesterol.  Calcium.  Other substances. What increases the risk? The following factors may make you more likely to develop this condition:  Having certain medical conditions, such as: ? High cholesterol. ? High blood pressure. ? Diabetes. ? Obesity.  Smoking.  A family history of cardiovascular disease.  Not being active or lack of regular exercise.  Being male. Men have a higher risk of having arteries become narrow and harden earlier in life than women.  Old age. What are the signs or symptoms? This condition may not have any signs or symptoms until a stroke or TIA happens. In some cases, your doctor may be able to hear a whooshing sound. This can suggest a change in blood flow caused by plaque buildup. An eye exam can also help find signs of the condition. How is this treated? This condition may be treated with more than one treatment. Treatment options include:  Lifestyle changes, such as: ? Quitting smoking. ? Getting regular exercise, or getting exercise as told by your doctor. ? Eating a healthy diet. ? Managing stress. ? Keeping a healthy weight.  Medicines to control: ? Blood pressure. ? Cholesterol. ? Blood clotting.  Surgery. You may have: ? A surgery to remove the blockages in  the carotid arteries. ? A procedure in which a small mesh tube (stent) is used to widen the blocked carotid arteries. Follow these instructions at home: Eating and drinking Follow instructions about your diet from your doctor. It is important to follow a healthy diet.  Eat a diet that includes: ? A lot of fresh fruits and vegetables. ? Low-fat (lean) meats.  Avoid these foods: ? Foods that are high in fat. ? Foods that are high in salt (sodium). ? Foods that are fried. ? Foods that are processed. ? Foods that have few good nutrients (poor nutritional value).   Lifestyle  Keep a healthy weight.  Do exercises as told by your doctor to stay active. Each week, you should get one of the following: ? At least 150 minutes of exercise that raises your heart rate and makes you sweat (moderate-intensity exercise). ? At least 75 minutes of exercise that takes a lot of effort.  Do not use any products that contain nicotine or tobacco, such as cigarettes, e-cigarettes, and chewing tobacco. If you need help quitting, ask your doctor.  Do not drink alcohol if: ? Your doctor tells you not to drink. ? You are pregnant, may be pregnant, or are planning to become pregnant.  If you drink alcohol: ? Limit how much you use to:  0-1 drink a day for women.  0-2 drinks a day for men. ? Be aware of how much alcohol is in your drink. In the [place] one drink equals one 12 oz bottle of beer (355 mL), one 5   oz glass of wine (148 mL), or one 1 oz glass of hard liquor (44 mL).  Do not use drugs.  Manage your stress. Ask your doctor for tips on how to do this.   General instructions  Take over-the-counter and prescription medicines only as told by your doctor.  Keep all follow-up visits as told by your doctor. This is important. Where to find more information  American Heart Association: www.heart.org Get help right away if:  You have any signs of a stroke. "BE FAST" is an easy way to remember the  main warning signs: ? B - Balance. Signs are dizziness, sudden trouble walking, or loss of balance. ? E - Eyes. Signs are trouble seeing or a change in how you see. ? F - Face. Signs are sudden weakness or loss of feeling of the face, or the face or eyelid drooping on one side. ? A - Arms. Signs are weakness or loss of feeling in an arm. This happens suddenly and usually on one side of the body. ? S - Speech. Signs are sudden trouble speaking, slurred speech, or trouble understanding what people say. ? T - Time. Time to call emergency services. Write down what time symptoms started.  You have other signs of a stroke, such as: ? A sudden, very bad headache with no known cause. ? Feeling like you may vomit (nausea). ? Vomiting. ? A seizure. These symptoms may be an emergency. Do not wait to see if the symptoms will go away. Get medical help right away. Call your local emergency services (911 in the [place] Do not drive yourself to the hospital. Summary  The carotid arteries are blood vessels on both sides of the neck.  If these arteries get smaller or get blocked, you are more likely to have a stroke or a mini-stroke.  This condition can be treated with lifestyle changes, medicines, surgery, or a blend of these treatments.  Get help right away if you have any signs of a stroke. "BE FAST" is an easy way to remember the main warning signs of stroke. This information is not intended to replace advice given to you by your health care provider. Make sure you discuss any questions you have with your health care provider. Document Revised: 06/11/2019 Document Reviewed: 06/11/2019 Elsevier Patient Education  2021 [employer].  

## 2020-12-16 NOTE — H&P (View-Only) (Signed)
Patient ID: Seth Holt, male   DOB: November 03, 1946, 75 y.o.   MRN: [id number]  Chief Complaint  Patient presents with  . New Patient (Initial Visit)    Carotis stenosis Carotid U/S done    HPI Seth Holt is a 75 y.o. male.  I am asked to see the patient by Dr. George Ina for evaluation of carotid disease with two recent amaurosis episodes.  The patient reports 1 episode about a month ago and then another episode about 2 weeks ago.  Both were in the left eye.  Both lasted less than a minute but he describes it as essentially a shade coming down over his left eye.  He had no arm or leg weakness or numbness.  He had no speech or swallowing difficulties.  No facial droop.  He never had any previous episodes.  He does report that his cholesterol has been running in the 220s to 230s.  He occasionally smokes.  This prompted a carotid duplex done at the hospital which I have reviewed.  He does have atherosclerotic plaque bilaterally.  On the right, the degree of blockages less than 50% but on the left this is interpreted as a 50 to 69% stenosis.   Past Medical History:  Diagnosis Date  . Asthma    enviromental allergies  . Cancer ([place])   . GERD (gastroesophageal reflux disease)   . HOH (hard of hearing)    AIDS  . Vertigo     Past Surgical History:  Procedure Laterality Date  . CATARACT EXTRACTION W/PHACO Left 04/06/2018   Procedure: CATARACT EXTRACTION PHACO AND INTRAOCULAR LENS PLACEMENT (IOC);  Surgeon: Eulogio Bear, MD;  Location: [facility];  Service: Ophthalmology;  Laterality: Left;  [place] 00:19.8 AP% 5.8 CDE 1.15 Fluid pack lot # [id number]  . CATARACT EXTRACTION W/PHACO Right 05/04/2018   Procedure: CATARACT EXTRACTION PHACO AND INTRAOCULAR LENS PLACEMENT (IOC);  Surgeon: Eulogio Bear, MD;  Location: [facility];  Service: Ophthalmology;  Laterality: Right;  fluid pack lot # [id number] H  exp 12/29/2018 [place] 00:27.7 AP% 7.1 CDE 1.98  . COLONOSCOPY WITH PROPOFOL N/A 08/22/2019    Procedure: COLONOSCOPY WITH PROPOFOL;  Surgeon: Toledo, Benay Pike, MD;  Location: [facility];  Service: Gastroenterology;  Laterality: N/A;  . EYE SURGERY    . JOINT REPLACEMENT     TKR  . KNEE ARTHROPLASTY Right 08/18/2016   Procedure: COMPUTER ASSISTED TOTAL KNEE ARTHROPLASTY;  Surgeon: Dereck Leep, MD;  Location: [facility];  Service: Orthopedics;  Laterality: Right;  . LACERATION REPAIR Left    thumb    Family History No bleeding disorders, clotting disorders, autoimmune diseases, or aneurysms   Social History   Tobacco Use  . Smoking status: Never Smoker  . Smokeless tobacco: Never Used  Substance Use Topics  . Alcohol use: No  . Drug use: No     No Known Allergies  Current Outpatient Medications  Medication Sig Dispense Refill  . acetaminophen (TYLENOL) 500 MG chewable tablet Chew 1,000 mg by mouth every 6 (six) hours as needed for pain.     Marland Kitchen aspirin 325 MG tablet Take 325 mg by mouth daily.    . calcium carbonate (TUMS EX) 750 MG chewable tablet Chew 1-2 tablets by mouth daily as needed for heartburn.     . chlorpheniramine (CHLOR-TRIMETON) 4 MG tablet Take 4 mg by mouth 2 (two) times daily as needed for allergies.    . diphenhydrAMINE (BENADRYL) 25 MG tablet Take 25 mg by  mouth at bedtime.    Marland Kitchen glucosamine-chondroitin 500-400 MG tablet Take 1 tablet by mouth 3 (three) times daily.    Marland Kitchen Hyaluronic Acid-Vitamin C (HYALURONIC ACID PO) Take 400 mg by mouth daily. 200 mg per cap    . Multiple Vitamin (MULTIVITAMIN) capsule Take 1 capsule by mouth daily.    . naproxen (NAPROSYN) 375 MG tablet Take 375 mg by mouth 2 (two) times daily as needed.    Marland Kitchen omega-3 acid ethyl esters (LOVAZA) 1 g capsule Take 1 g by mouth daily.    Marland Kitchen PRESCRIPTION MEDICATION Place 1 drop into the left eye 2 (two) times daily. Prednisolone 10mg  gatifloxacin 5mg  bromfenac 0.75mg      No current facility-administered medications for this visit.      REVIEW OF SYSTEMS (Negative unless  checked)  Constitutional: [] Weight loss  [] Fever  [] Chills Cardiac: [] Chest pain   [] Chest pressure   [] Palpitations   [] Shortness of breath when laying flat   [] Shortness of breath at rest   [] Shortness of breath with exertion. Vascular:  [] Pain in legs with walking   [] Pain in legs at rest   [] Pain in legs when laying flat   [] Claudication   [] Pain in feet when walking  [] Pain in feet at rest  [] Pain in feet when laying flat   [] History of DVT   [] Phlebitis   [] Swelling in legs   [] Varicose veins   [] Non-healing ulcers Pulmonary:   [] Uses home oxygen   [] Productive cough   [] Hemoptysis   [] Wheeze  [] COPD   [x] Asthma Neurologic:  [] Dizziness  [] Blackouts   [] Seizures   [] History of stroke   [] History of TIA  [] Aphasia   [x] Temporary blindness   [] Dysphagia   [] Weakness or numbness in arms   [] Weakness or numbness in legs Musculoskeletal:  [x] Arthritis   [] Joint swelling   [x] Joint pain   [] Low back pain Hematologic:  [] Easy bruising  [] Easy bleeding   [] Hypercoagulable state   [] Anemic  [] Hepatitis Gastrointestinal:  [] Blood in stool   [] Vomiting blood  [x] Gastroesophageal reflux/heartburn   [] Abdominal pain Genitourinary:  [] Chronic kidney disease   [] Difficult urination  [] Frequent urination  [] Burning with urination   [] Hematuria Skin:  [] Rashes   [] Ulcers   [] Wounds Psychological:  [] History of anxiety   []  History of major depression.    Physical Exam BP (!) 151/77   Pulse 73   Ht 6' (1.829 m)   Wt 242 lb (109.8 kg)   BMI 32.82 kg/m  Gen:  WD/WN, NAD Head: [place]/AT, No temporalis wasting.  Ear/Nose/Throat: Hearing grossly intact, nares w/o erythema or drainage, oropharynx w/o Erythema/Exudate Eyes: Conjunctiva clear, sclera non-icteric  Neck: trachea midline.  Soft left carotid bruit Pulmonary:  Good air movement, clear to auscultation bilaterally.  Cardiac: RRR, normal S1, S2 Vascular:  Vessel Right Left  Radial Palpable Palpable           Musculoskeletal: M/S 5/5 throughout.   Extremities without ischemic changes.  No deformity or atrophy.  No edema. Neurologic: Sensation grossly intact in extremities.  Symmetrical.  Speech is fluent. Motor exam as listed above. Psychiatric: Judgment intact, Mood & affect appropriate for pt's clinical situation. Dermatologic: No rashes or ulcers noted.  No cellulitis or open wounds.    Radiology US Carotid Bilateral  Result Date: 12/09/2020 CLINICAL DATA:  75 year old with amaurosis fugax. EXAM: BILATERAL CAROTID DUPLEX ULTRASOUND TECHNIQUE: Pearline Cables scale imaging, color Doppler and duplex ultrasound were performed of bilateral carotid and vertebral arteries in the neck. COMPARISON:  None. FINDINGS:  Criteria: Quantification of carotid stenosis is based on velocity parameters that correlate the residual internal carotid diameter with NASCET-based stenosis levels, using the diameter of the distal internal carotid lumen as the denominator for stenosis measurement. The following velocity measurements were obtained: RIGHT ICA: 85/30 cm/sec CCA: 786/76 cm/sec SYSTOLIC ICA/CCA RATIO:  0.7 ECA: 81 cm/sec LEFT ICA: 181/80 cm/sec CCA: 720/94 cm/sec SYSTOLIC ICA/CCA RATIO:  1.6 ECA: 102 cm/sec RIGHT CAROTID ARTERY: Small amount of heterogeneous plaque in the distal common carotid artery. External carotid artery is patent with normal waveform. Normal waveforms and velocities in the internal carotid artery. RIGHT VERTEBRAL ARTERY: Antegrade flow and normal waveform in the right vertebral artery. LEFT CAROTID ARTERY: Small amount of echogenic plaque at the left carotid bulb.External carotid artery is patent with normal waveform. Heterogeneous plaque in the proximal internal carotid artery. Narrowing in the proximal internal carotid artery based on the color Doppler images. Peak systolic velocity in the left internal carotid artery measures 181 cm/sec. LEFT VERTEBRAL ARTERY: Antegrade flow and normal waveform in the left vertebral artery. IMPRESSION: 1.  Atherosclerotic disease in the carotid arteries, most prominent at the left internal carotid artery. 2. Estimated degree of stenosis in the left internal carotid artery is 50-69%. 3. No significant stenosis in the right internal carotid artery and estimated degree of stenosis is less than 50%. 4. Patent vertebral arteries with antegrade flow. Electronically Signed   By: Markus Daft M.D.   On: 12/09/2020 16:38    Labs No results found for this or any previous visit (from the past 2160 hour(s)).  Assessment/Plan:  Carotid stenosis This prompted a carotid duplex done at the hospital which I have reviewed.  He does have atherosclerotic plaque bilaterally.  On the right, the degree of blockages less than 50% but on the left this is interpreted as a 50 to 69% stenosis. We had a long discussion today with the patient regarding treatment options.  We discussed that for symptomatic lesion 60% or greater appropriate to be treated and potential even 50% depending on the patient's physiology.  He is reasonably young and healthy.  We discussed 2 options for treatment which would include open carotid endarterectomy and carotid stenting.  We have discussed the option of going a CT angiogram to evaluate the anatomy and help decide or doing an angiogram with the intention to treat if the anatomy is appropriate for carotid stenting.  He prefers the latter.  We will go ahead and get him started on Lipitor and Plavix and prescriptions will be sent in today.  We discussed the risks and benefits the procedure including stroke, bleeding, nephrotoxicity, and cardiac issues.  We discussed the differences between carotid artery stenting and carotid endarterectomy.  He voices his understanding and is agreeable to proceed.  Amaurosis fugax 2 episodes.  Likely from his carotid disease.  See treatment plan as above  Hyperlipidemia lipid control important in reducing the progression of atherosclerotic disease. Start statin therapy  both to treat his hyperlipidemia as well as to stabilize the plaque prior to carotid intervention.       Leotis Pain 12/16/2020, 11:14 AM   This note was created with Dragon medical transcription system.  Any errors from dictation are unintentional.

## 2020-12-16 NOTE — Assessment & Plan Note (Signed)
This prompted a carotid duplex done at the hospital which I have reviewed.  He does have atherosclerotic plaque bilaterally.  On the right, the degree of blockages less than 50% but on the left this is interpreted as a 50 to 69% stenosis. We had a long discussion today with the patient regarding treatment options.  We discussed that for symptomatic lesion 60% or greater appropriate to be treated and potential even 50% depending on the patient's physiology.  He is reasonably young and healthy.  We discussed 2 options for treatment which would include open carotid endarterectomy and carotid stenting.  We have discussed the option of going a CT angiogram to evaluate the anatomy and help decide or doing an angiogram with the intention to treat if the anatomy is appropriate for carotid stenting.  He prefers the latter.  We will go ahead and get him started on Lipitor and Plavix and prescriptions will be sent in today.  We discussed the risks and benefits the procedure including stroke, bleeding, nephrotoxicity, and cardiac issues.  We discussed the differences between carotid artery stenting and carotid endarterectomy.  He voices his understanding and is agreeable to proceed.

## 2020-12-16 NOTE — Progress Notes (Signed)
Patient ID: Seth Holt, male   DOB: November 03, 1946, 75 y.o.   MRN: [id number]  Chief Complaint  Patient presents with  . New Patient (Initial Visit)    Carotis stenosis Carotid U/S done    HPI Seth Holt is a 75 y.o. male.  I am asked to see the patient by Dr. George Ina for evaluation of carotid disease with two recent amaurosis episodes.  The patient reports 1 episode about a month ago and then another episode about 2 weeks ago.  Both were in the left eye.  Both lasted less than a minute but he describes it as essentially a shade coming down over his left eye.  He had no arm or leg weakness or numbness.  He had no speech or swallowing difficulties.  No facial droop.  He never had any previous episodes.  He does report that his cholesterol has been running in the 220s to 230s.  He occasionally smokes.  This prompted a carotid duplex done at the hospital which I have reviewed.  He does have atherosclerotic plaque bilaterally.  On the right, the degree of blockages less than 50% but on the left this is interpreted as a 50 to 69% stenosis.   Past Medical History:  Diagnosis Date  . Asthma    enviromental allergies  . Cancer ([place])   . GERD (gastroesophageal reflux disease)   . HOH (hard of hearing)    AIDS  . Vertigo     Past Surgical History:  Procedure Laterality Date  . CATARACT EXTRACTION W/PHACO Left 04/06/2018   Procedure: CATARACT EXTRACTION PHACO AND INTRAOCULAR LENS PLACEMENT (IOC);  Surgeon: Eulogio Bear, MD;  Location: [facility];  Service: Ophthalmology;  Laterality: Left;  [place] 00:19.8 AP% 5.8 CDE 1.15 Fluid pack lot # [id number]  . CATARACT EXTRACTION W/PHACO Right 05/04/2018   Procedure: CATARACT EXTRACTION PHACO AND INTRAOCULAR LENS PLACEMENT (IOC);  Surgeon: Eulogio Bear, MD;  Location: [facility];  Service: Ophthalmology;  Laterality: Right;  fluid pack lot # [id number] H  exp 12/29/2018 [place] 00:27.7 AP% 7.1 CDE 1.98  . COLONOSCOPY WITH PROPOFOL N/A 08/22/2019    Procedure: COLONOSCOPY WITH PROPOFOL;  Surgeon: Toledo, Benay Pike, MD;  Location: [facility];  Service: Gastroenterology;  Laterality: N/A;  . EYE SURGERY    . JOINT REPLACEMENT     TKR  . KNEE ARTHROPLASTY Right 08/18/2016   Procedure: COMPUTER ASSISTED TOTAL KNEE ARTHROPLASTY;  Surgeon: Dereck Leep, MD;  Location: [facility];  Service: Orthopedics;  Laterality: Right;  . LACERATION REPAIR Left    thumb    Family History No bleeding disorders, clotting disorders, autoimmune diseases, or aneurysms   Social History   Tobacco Use  . Smoking status: Never Smoker  . Smokeless tobacco: Never Used  Substance Use Topics  . Alcohol use: No  . Drug use: No     No Known Allergies  Current Outpatient Medications  Medication Sig Dispense Refill  . acetaminophen (TYLENOL) 500 MG chewable tablet Chew 1,000 mg by mouth every 6 (six) hours as needed for pain.     Marland Kitchen aspirin 325 MG tablet Take 325 mg by mouth daily.    . calcium carbonate (TUMS EX) 750 MG chewable tablet Chew 1-2 tablets by mouth daily as needed for heartburn.     . chlorpheniramine (CHLOR-TRIMETON) 4 MG tablet Take 4 mg by mouth 2 (two) times daily as needed for allergies.    . diphenhydrAMINE (BENADRYL) 25 MG tablet Take 25 mg by  mouth at bedtime.    Marland Kitchen glucosamine-chondroitin 500-400 MG tablet Take 1 tablet by mouth 3 (three) times daily.    Marland Kitchen Hyaluronic Acid-Vitamin C (HYALURONIC ACID PO) Take 400 mg by mouth daily. 200 mg per cap    . Multiple Vitamin (MULTIVITAMIN) capsule Take 1 capsule by mouth daily.    . naproxen (NAPROSYN) 375 MG tablet Take 375 mg by mouth 2 (two) times daily as needed.    Marland Kitchen omega-3 acid ethyl esters (LOVAZA) 1 g capsule Take 1 g by mouth daily.    Marland Kitchen PRESCRIPTION MEDICATION Place 1 drop into the left eye 2 (two) times daily. Prednisolone 10mg  gatifloxacin 5mg  bromfenac 0.75mg      No current facility-administered medications for this visit.      REVIEW OF SYSTEMS (Negative unless  checked)  Constitutional: [] Weight loss  [] Fever  [] Chills Cardiac: [] Chest pain   [] Chest pressure   [] Palpitations   [] Shortness of breath when laying flat   [] Shortness of breath at rest   [] Shortness of breath with exertion. Vascular:  [] Pain in legs with walking   [] Pain in legs at rest   [] Pain in legs when laying flat   [] Claudication   [] Pain in feet when walking  [] Pain in feet at rest  [] Pain in feet when laying flat   [] History of DVT   [] Phlebitis   [] Swelling in legs   [] Varicose veins   [] Non-healing ulcers Pulmonary:   [] Uses home oxygen   [] Productive cough   [] Hemoptysis   [] Wheeze  [] COPD   [x] Asthma Neurologic:  [] Dizziness  [] Blackouts   [] Seizures   [] History of stroke   [] History of TIA  [] Aphasia   [x] Temporary blindness   [] Dysphagia   [] Weakness or numbness in arms   [] Weakness or numbness in legs Musculoskeletal:  [x] Arthritis   [] Joint swelling   [x] Joint pain   [] Low back pain Hematologic:  [] Easy bruising  [] Easy bleeding   [] Hypercoagulable state   [] Anemic  [] Hepatitis Gastrointestinal:  [] Blood in stool   [] Vomiting blood  [x] Gastroesophageal reflux/heartburn   [] Abdominal pain Genitourinary:  [] Chronic kidney disease   [] Difficult urination  [] Frequent urination  [] Burning with urination   [] Hematuria Skin:  [] Rashes   [] Ulcers   [] Wounds Psychological:  [] History of anxiety   []  History of major depression.    Physical Exam BP (!) 151/77   Pulse 73   Ht 6' (1.829 m)   Wt 242 lb (109.8 kg)   BMI 32.82 kg/m  Gen:  WD/WN, NAD Head: [place]/AT, No temporalis wasting.  Ear/Nose/Throat: Hearing grossly intact, nares w/o erythema or drainage, oropharynx w/o Erythema/Exudate Eyes: Conjunctiva clear, sclera non-icteric  Neck: trachea midline.  Soft left carotid bruit Pulmonary:  Good air movement, clear to auscultation bilaterally.  Cardiac: RRR, normal S1, S2 Vascular:  Vessel Right Left  Radial Palpable Palpable           Musculoskeletal: M/S 5/5 throughout.   Extremities without ischemic changes.  No deformity or atrophy.  No edema. Neurologic: Sensation grossly intact in extremities.  Symmetrical.  Speech is fluent. Motor exam as listed above. Psychiatric: Judgment intact, Mood & affect appropriate for pt's clinical situation. Dermatologic: No rashes or ulcers noted.  No cellulitis or open wounds.    Radiology US Carotid Bilateral  Result Date: 12/09/2020 CLINICAL DATA:  75 year old with amaurosis fugax. EXAM: BILATERAL CAROTID DUPLEX ULTRASOUND TECHNIQUE: Pearline Cables scale imaging, color Doppler and duplex ultrasound were performed of bilateral carotid and vertebral arteries in the neck. COMPARISON:  None. FINDINGS:  Criteria: Quantification of carotid stenosis is based on velocity parameters that correlate the residual internal carotid diameter with NASCET-based stenosis levels, using the diameter of the distal internal carotid lumen as the denominator for stenosis measurement. The following velocity measurements were obtained: RIGHT ICA: 85/30 cm/sec CCA: 786/76 cm/sec SYSTOLIC ICA/CCA RATIO:  0.7 ECA: 81 cm/sec LEFT ICA: 181/80 cm/sec CCA: 720/94 cm/sec SYSTOLIC ICA/CCA RATIO:  1.6 ECA: 102 cm/sec RIGHT CAROTID ARTERY: Small amount of heterogeneous plaque in the distal common carotid artery. External carotid artery is patent with normal waveform. Normal waveforms and velocities in the internal carotid artery. RIGHT VERTEBRAL ARTERY: Antegrade flow and normal waveform in the right vertebral artery. LEFT CAROTID ARTERY: Small amount of echogenic plaque at the left carotid bulb.External carotid artery is patent with normal waveform. Heterogeneous plaque in the proximal internal carotid artery. Narrowing in the proximal internal carotid artery based on the color Doppler images. Peak systolic velocity in the left internal carotid artery measures 181 cm/sec. LEFT VERTEBRAL ARTERY: Antegrade flow and normal waveform in the left vertebral artery. IMPRESSION: 1.  Atherosclerotic disease in the carotid arteries, most prominent at the left internal carotid artery. 2. Estimated degree of stenosis in the left internal carotid artery is 50-69%. 3. No significant stenosis in the right internal carotid artery and estimated degree of stenosis is less than 50%. 4. Patent vertebral arteries with antegrade flow. Electronically Signed   By: Markus Daft M.D.   On: 12/09/2020 16:38    Labs No results found for this or any previous visit (from the past 2160 hour(s)).  Assessment/Plan:  Carotid stenosis This prompted a carotid duplex done at the hospital which I have reviewed.  He does have atherosclerotic plaque bilaterally.  On the right, the degree of blockages less than 50% but on the left this is interpreted as a 50 to 69% stenosis. We had a long discussion today with the patient regarding treatment options.  We discussed that for symptomatic lesion 60% or greater appropriate to be treated and potential even 50% depending on the patient's physiology.  He is reasonably young and healthy.  We discussed 2 options for treatment which would include open carotid endarterectomy and carotid stenting.  We have discussed the option of going a CT angiogram to evaluate the anatomy and help decide or doing an angiogram with the intention to treat if the anatomy is appropriate for carotid stenting.  He prefers the latter.  We will go ahead and get him started on Lipitor and Plavix and prescriptions will be sent in today.  We discussed the risks and benefits the procedure including stroke, bleeding, nephrotoxicity, and cardiac issues.  We discussed the differences between carotid artery stenting and carotid endarterectomy.  He voices his understanding and is agreeable to proceed.  Amaurosis fugax 2 episodes.  Likely from his carotid disease.  See treatment plan as above  Hyperlipidemia lipid control important in reducing the progression of atherosclerotic disease. Start statin therapy  both to treat his hyperlipidemia as well as to stabilize the plaque prior to carotid intervention.       Leotis Pain 12/16/2020, 11:14 AM   This note was created with Dragon medical transcription system.  Any errors from dictation are unintentional.

## 2020-12-16 NOTE — Assessment & Plan Note (Signed)
2 episodes.  Likely from his carotid disease.  See treatment plan as above

## 2020-12-16 NOTE — Assessment & Plan Note (Signed)
lipid control important in reducing the progression of atherosclerotic disease. Start statin therapy both to treat his hyperlipidemia as well as to stabilize the plaque prior to carotid intervention.

## 2020-12-18 ENCOUNTER — Encounter (INDEPENDENT_AMBULATORY_CARE_PROVIDER_SITE_OTHER): Payer: Self-pay

## 2020-12-18 ENCOUNTER — Telehealth (INDEPENDENT_AMBULATORY_CARE_PROVIDER_SITE_OTHER): Payer: Self-pay

## 2020-12-18 ENCOUNTER — Other Ambulatory Visit (INDEPENDENT_AMBULATORY_CARE_PROVIDER_SITE_OTHER): Payer: Self-pay | Admitting: Nurse Practitioner

## 2020-12-18 NOTE — Telephone Encounter (Signed)
I called the pt and spoke to his wife an made her aware of his pre procedure instructions and COVID test instructions and date and location and time of testing.

## 2020-12-19 ENCOUNTER — Other Ambulatory Visit: Payer: Self-pay

## 2020-12-19 ENCOUNTER — Other Ambulatory Visit
Admission: RE | Admit: 2020-12-19 | Discharge: 2020-12-19 | Disposition: A | Discharge status: Home / Self Care | Payer: PPO | Source: Ambulatory Visit | Attending: Vascular Surgery | Admitting: Vascular Surgery

## 2020-12-19 DIAGNOSIS — G453 Amaurosis fugax: Secondary | ICD-10-CM | POA: Diagnosis present

## 2020-12-19 DIAGNOSIS — J45909 Unspecified asthma, uncomplicated: Secondary | ICD-10-CM | POA: Diagnosis present

## 2020-12-19 DIAGNOSIS — Z96651 Presence of right artificial knee joint: Secondary | ICD-10-CM | POA: Diagnosis present

## 2020-12-19 DIAGNOSIS — Z20822 Contact with and (suspected) exposure to covid-19: Secondary | ICD-10-CM | POA: Diagnosis present

## 2020-12-19 DIAGNOSIS — E785 Hyperlipidemia, unspecified: Secondary | ICD-10-CM | POA: Diagnosis present

## 2020-12-19 DIAGNOSIS — R42 Dizziness and giddiness: Secondary | ICD-10-CM | POA: Diagnosis present

## 2020-12-19 DIAGNOSIS — Z01812 Encounter for preprocedural laboratory examination: Secondary | ICD-10-CM | POA: Insufficient documentation

## 2020-12-19 DIAGNOSIS — Z79899 Other long term (current) drug therapy: Secondary | ICD-10-CM | POA: Diagnosis not present

## 2020-12-19 DIAGNOSIS — H9190 Unspecified hearing loss, unspecified ear: Secondary | ICD-10-CM | POA: Diagnosis present

## 2020-12-19 DIAGNOSIS — K219 Gastro-esophageal reflux disease without esophagitis: Secondary | ICD-10-CM | POA: Diagnosis present

## 2020-12-19 DIAGNOSIS — I6522 Occlusion and stenosis of left carotid artery: Secondary | ICD-10-CM | POA: Diagnosis present

## 2020-12-19 DIAGNOSIS — Z7982 Long term (current) use of aspirin: Secondary | ICD-10-CM | POA: Diagnosis not present

## 2020-12-19 LAB — SARS CORONAVIRUS 2 (TAT 6-24 HRS): SARS Coronavirus 2: NEGATIVE

## 2020-12-22 ENCOUNTER — Other Ambulatory Visit (INDEPENDENT_AMBULATORY_CARE_PROVIDER_SITE_OTHER): Payer: Self-pay | Admitting: Nurse Practitioner

## 2020-12-22 ENCOUNTER — Other Ambulatory Visit: Payer: Self-pay

## 2020-12-22 ENCOUNTER — Encounter: Payer: Self-pay | Admitting: Vascular Surgery

## 2020-12-22 ENCOUNTER — Inpatient Hospital Stay
Admission: RE | Admit: 2020-12-22 | Discharge: 2020-12-23 | DRG: 038 | Disposition: A | Discharge status: Home / Self Care | Payer: PPO | Attending: Vascular Surgery | Admitting: Vascular Surgery

## 2020-12-22 ENCOUNTER — Encounter
Admission: RE | Disposition: A | Discharge status: Home / Self Care | Payer: Self-pay | Source: Home / Self Care | Attending: Vascular Surgery

## 2020-12-22 DIAGNOSIS — Z20822 Contact with and (suspected) exposure to covid-19: Secondary | ICD-10-CM | POA: Diagnosis present

## 2020-12-22 DIAGNOSIS — H9190 Unspecified hearing loss, unspecified ear: Secondary | ICD-10-CM | POA: Diagnosis present

## 2020-12-22 DIAGNOSIS — Z79899 Other long term (current) drug therapy: Secondary | ICD-10-CM | POA: Diagnosis not present

## 2020-12-22 DIAGNOSIS — E785 Hyperlipidemia, unspecified: Secondary | ICD-10-CM | POA: Diagnosis present

## 2020-12-22 DIAGNOSIS — R42 Dizziness and giddiness: Secondary | ICD-10-CM | POA: Diagnosis present

## 2020-12-22 DIAGNOSIS — I6522 Occlusion and stenosis of left carotid artery: Secondary | ICD-10-CM

## 2020-12-22 DIAGNOSIS — Z7982 Long term (current) use of aspirin: Secondary | ICD-10-CM

## 2020-12-22 DIAGNOSIS — J45909 Unspecified asthma, uncomplicated: Secondary | ICD-10-CM | POA: Diagnosis present

## 2020-12-22 DIAGNOSIS — G453 Amaurosis fugax: Secondary | ICD-10-CM | POA: Diagnosis present

## 2020-12-22 DIAGNOSIS — K219 Gastro-esophageal reflux disease without esophagitis: Secondary | ICD-10-CM | POA: Diagnosis present

## 2020-12-22 DIAGNOSIS — Z96651 Presence of right artificial knee joint: Secondary | ICD-10-CM | POA: Diagnosis present

## 2020-12-22 HISTORY — PX: CAROTID PTA/STENT INTERVENTION: CATH118231

## 2020-12-22 LAB — CREATININE, SERUM
Creatinine, Ser: 1.02 mg/dL (ref 0.61–1.24)
GFR, Estimated: >60 mL/min (ref >60)

## 2020-12-22 LAB — POCT ACTIVATED CLOTTING TIME: Activated Clotting Time: 267 seconds

## 2020-12-22 LAB — BUN: BUN: 11 mg/dL (ref 8–23)

## 2020-12-22 SURGERY — CAROTID PTA/STENT INTERVENTION
Anesthesia: Moderate Sedation | Laterality: Left

## 2020-12-22 MED ORDER — SODIUM CHLORIDE 0.9 % IV SOLN
500.0000 mL | Freq: Once | INTRAVENOUS | Status: AC | PRN
  Administered 2020-12-22: 500 mL via INTRAVENOUS

## 2020-12-22 MED ORDER — DIPHENHYDRAMINE HCL 25 MG PO CAPS
25.0000 mg | ORAL_CAPSULE | Freq: Every day | ORAL | Status: DC
  Administered 2020-12-22: 25 mg via ORAL
  Filled 2020-12-22 (×2): qty 1

## 2020-12-22 MED ORDER — ATORVASTATIN CALCIUM 10 MG PO TABS
10.0000 mg | ORAL_TABLET | Freq: Every day | ORAL | Status: DC
  Administered 2020-12-23: 10 mg via ORAL
  Filled 2020-12-22 (×2): qty 1

## 2020-12-22 MED ORDER — CEFAZOLIN SODIUM-DEXTROSE 2-4 GM/100ML-% IV SOLN
2.0000 g | Freq: Once | INTRAVENOUS | Status: AC
  Administered 2020-12-22: 2 g via INTRAVENOUS

## 2020-12-22 MED ORDER — HEPARIN SODIUM (PORCINE) 1000 UNIT/ML IJ SOLN
INTRAMUSCULAR | Status: AC
  Filled 2020-12-22: qty 1

## 2020-12-22 MED ORDER — ASPIRIN EC 81 MG PO TBEC
81.0000 mg | DELAYED_RELEASE_TABLET | Freq: Every day | ORAL | Status: DC
  Administered 2020-12-23: 81 mg via ORAL
  Filled 2020-12-22: qty 1

## 2020-12-22 MED ORDER — OXYCODONE-ACETAMINOPHEN 5-325 MG PO TABS: 1.0000 | ORAL_TABLET | ORAL | Status: DC | PRN

## 2020-12-22 MED ORDER — MIDAZOLAM HCL 2 MG/2ML IJ SOLN
INTRAMUSCULAR | Status: DC | PRN
  Administered 2020-12-22: 2 mg via INTRAVENOUS
  Administered 2020-12-22: 1 mg via INTRAVENOUS

## 2020-12-22 MED ORDER — LABETALOL HCL 5 MG/ML IV SOLN: 10.0000 mg | INTRAVENOUS | Status: DC | PRN

## 2020-12-22 MED ORDER — METOPROLOL TARTRATE 5 MG/5ML IV SOLN: 2.0000 mg | INTRAVENOUS | Status: DC | PRN

## 2020-12-22 MED ORDER — OXYCODONE-ACETAMINOPHEN 5-325 MG PO TABS: 1.0000 | ORAL_TABLET | Freq: Four times a day (QID) | ORAL | Status: DC | PRN

## 2020-12-22 MED ORDER — CALCIUM CARBONATE ANTACID 500 MG PO CHEW
1.0000 | CHEWABLE_TABLET | Freq: Every day | ORAL | Status: DC | PRN
  Filled 2020-12-22: qty 2

## 2020-12-22 MED ORDER — FAMOTIDINE IN NACL 20-0.9 MG/50ML-% IV SOLN
20.0000 mg | Freq: Two times a day (BID) | INTRAVENOUS | Status: DC
  Administered 2020-12-22: 20 mg via INTRAVENOUS
  Filled 2020-12-22 (×4): qty 50

## 2020-12-22 MED ORDER — ONDANSETRON HCL 4 MG/2ML IJ SOLN: 4.0000 mg | Freq: Four times a day (QID) | INTRAMUSCULAR | Status: DC | PRN

## 2020-12-22 MED ORDER — ASPIRIN EC 81 MG PO TBEC: 81.0000 mg | DELAYED_RELEASE_TABLET | Freq: Every day | ORAL | Status: DC

## 2020-12-22 MED ORDER — METHYLPREDNISOLONE SODIUM SUCC 125 MG IJ SOLR: 125.0000 mg | Freq: Once | INTRAMUSCULAR | Status: DC | PRN

## 2020-12-22 MED ORDER — DOPAMINE-DEXTROSE 3.2-5 MG/ML-% IV SOLN
INTRAVENOUS | Status: AC
  Filled 2020-12-22: qty 250

## 2020-12-22 MED ORDER — PHENYLEPHRINE HCL (PRESSORS) 10 MG/ML IV SOLN
INTRAVENOUS | Status: AC
  Filled 2020-12-22: qty 1

## 2020-12-22 MED ORDER — ALUM & MAG HYDROXIDE-SIMETH 200-200-20 MG/5ML PO SUSP
15.0000 mL | ORAL | Status: DC | PRN
Start: 2020-12-22 — End: 2020-12-23

## 2020-12-22 MED ORDER — IODIXANOL 320 MG/ML IV SOLN
INTRAVENOUS | Status: DC | PRN
  Administered 2020-12-22: 50 mL

## 2020-12-22 MED ORDER — ACETAMINOPHEN 325 MG RE SUPP
325.0000 mg | RECTAL | Status: DC | PRN
  Filled 2020-12-22: qty 2

## 2020-12-22 MED ORDER — ATROPINE SULFATE 1 MG/10ML IJ SOSY
PREFILLED_SYRINGE | INTRAMUSCULAR | Status: DC | PRN
  Administered 2020-12-22: 1 mg via INTRAVENOUS

## 2020-12-22 MED ORDER — ATROPINE SULFATE 1 MG/10ML IJ SOSY
PREFILLED_SYRINGE | INTRAMUSCULAR | Status: AC
  Filled 2020-12-22: qty 20

## 2020-12-22 MED ORDER — GUAIFENESIN-DM 100-10 MG/5ML PO SYRP
15.0000 mL | ORAL_SOLUTION | ORAL | Status: DC | PRN
  Filled 2020-12-22: qty 15

## 2020-12-22 MED ORDER — FAMOTIDINE 20 MG PO TABS: 40.0000 mg | ORAL_TABLET | Freq: Once | ORAL | Status: DC | PRN

## 2020-12-22 MED ORDER — CEFAZOLIN SODIUM-DEXTROSE 2-4 GM/100ML-% IV SOLN: 2.0000 g | Freq: Three times a day (TID) | INTRAVENOUS | Status: DC

## 2020-12-22 MED ORDER — ACETAMINOPHEN 325 MG PO TABS: 325.0000 mg | ORAL_TABLET | ORAL | Status: DC | PRN

## 2020-12-22 MED ORDER — HEPARIN SODIUM (PORCINE) 1000 UNIT/ML IJ SOLN
INTRAMUSCULAR | Status: DC | PRN
  Administered 2020-12-22: 8000 [IU] via INTRAVENOUS

## 2020-12-22 MED ORDER — MORPHINE SULFATE (PF) 4 MG/ML IV SOLN: 2.0000 mg | INTRAVENOUS | Status: DC | PRN

## 2020-12-22 MED ORDER — FENTANYL CITRATE (PF) 100 MCG/2ML IJ SOLN
INTRAMUSCULAR | Status: DC | PRN
  Administered 2020-12-22: 50 ug via INTRAVENOUS

## 2020-12-22 MED ORDER — SODIUM CHLORIDE 0.9 % IV SOLN: INTRAVENOUS | Status: DC

## 2020-12-22 MED ORDER — HYALURONIC ACID 20-60 MG PO CAPS: ORAL_CAPSULE | Freq: Every day | ORAL | Status: DC

## 2020-12-22 MED ORDER — DOPAMINE-DEXTROSE 3.2-5 MG/ML-% IV SOLN: 0.0000 ug/kg/min | INTRAVENOUS | Status: DC

## 2020-12-22 MED ORDER — CLOPIDOGREL BISULFATE 75 MG PO TABS
75.0000 mg | ORAL_TABLET | Freq: Every day | ORAL | Status: DC
  Administered 2020-12-23: 75 mg via ORAL
  Filled 2020-12-22: qty 1

## 2020-12-22 MED ORDER — SODIUM CHLORIDE 0.9 % IV SOLN: INTRAVENOUS | Status: AC

## 2020-12-22 MED ORDER — MAGNESIUM SULFATE 2 GM/50ML IV SOLN
2.0000 g | Freq: Every day | INTRAVENOUS | Status: DC | PRN
  Filled 2020-12-22: qty 50

## 2020-12-22 MED ORDER — FENTANYL CITRATE (PF) 100 MCG/2ML IJ SOLN
INTRAMUSCULAR | Status: AC
  Filled 2020-12-22: qty 2

## 2020-12-22 MED ORDER — DOPAMINE-DEXTROSE 3.2-5 MG/ML-% IV SOLN
INTRAVENOUS | Status: AC
  Administered 2020-12-22: 3 ug/kg/min via INTRAVENOUS
  Filled 2020-12-22: qty 250

## 2020-12-22 MED ORDER — MIDAZOLAM HCL 2 MG/ML PO SYRP: 8.0000 mg | ORAL_SOLUTION | Freq: Once | ORAL | Status: DC | PRN

## 2020-12-22 MED ORDER — ACETAMINOPHEN 80 MG PO CHEW: 1000.0000 mg | CHEWABLE_TABLET | Freq: Three times a day (TID) | ORAL | Status: DC | PRN

## 2020-12-22 MED ORDER — DIPHENHYDRAMINE HCL 50 MG/ML IJ SOLN: 50.0000 mg | Freq: Once | INTRAMUSCULAR | Status: DC | PRN

## 2020-12-22 MED ORDER — HYDROMORPHONE HCL 1 MG/ML IJ SOLN: 1.0000 mg | Freq: Once | INTRAMUSCULAR | Status: DC | PRN

## 2020-12-22 MED ORDER — PHENOL 1.4 % MT LIQD
1.0000 | OROMUCOSAL | Status: DC | PRN
  Filled 2020-12-22: qty 177

## 2020-12-22 MED ORDER — MIDAZOLAM HCL 5 MG/5ML IJ SOLN
INTRAMUSCULAR | Status: AC
  Filled 2020-12-22: qty 5

## 2020-12-22 MED ORDER — DIPHENHYDRAMINE HCL 25 MG PO CAPS
25.0000 mg | ORAL_CAPSULE | Freq: Four times a day (QID) | ORAL | Status: DC
  Administered 2020-12-22: 25 mg via ORAL
  Filled 2020-12-22 (×4): qty 1

## 2020-12-22 MED ORDER — ADULT MULTIVITAMIN W/MINERALS CH
1.0000 | ORAL_TABLET | Freq: Every day | ORAL | Status: DC
  Administered 2020-12-23: 1 via ORAL
  Filled 2020-12-22 (×2): qty 1

## 2020-12-22 MED ORDER — HYDRALAZINE HCL 20 MG/ML IJ SOLN: 5.0000 mg | INTRAMUSCULAR | Status: DC | PRN

## 2020-12-22 MED ORDER — POTASSIUM CHLORIDE CRYS ER 20 MEQ PO TBCR: 20.0000 meq | EXTENDED_RELEASE_TABLET | Freq: Every day | ORAL | Status: DC | PRN

## 2020-12-22 MED ORDER — CLOPIDOGREL BISULFATE 75 MG PO TABS: 75.0000 mg | ORAL_TABLET | Freq: Every day | ORAL | Status: DC

## 2020-12-22 MED ORDER — SODIUM CHLORIDE FLUSH 0.9 % IV SOLN
INTRAVENOUS | Status: AC
  Filled 2020-12-22: qty 30

## 2020-12-22 SURGICAL SUPPLY — 21 items
BALLN VTRAC 4.5X20X135 (BALLOONS) ×2
BALLOON VTRAC 4.5X20X135 (BALLOONS) ×1 IMPLANT
CATH ANGIO 5F PIGTAIL 100CM (CATHETERS) ×2 IMPLANT
CATH BEACON 5 .035 100 H1 TIP (CATHETERS) ×2 IMPLANT
COVER PROBE U/S 5X48 (MISCELLANEOUS) ×2 IMPLANT
DEVICE EMBOSHIELD NAV6 4.0-7.0 (FILTER) ×2 IMPLANT
DEVICE SAFEGUARD 24CM (GAUZE/BANDAGES/DRESSINGS) ×2 IMPLANT
DEVICE STARCLOSE SE CLOSURE (Vascular Products) ×2 IMPLANT
DEVICE TORQUE .025-.038 (MISCELLANEOUS) ×2 IMPLANT
GLIDEWIRE ANGLED SS 035X260CM (WIRE) ×2 IMPLANT
KIT CAROTID MANIFOLD (MISCELLANEOUS) ×2 IMPLANT
KIT ENCORE 26 ADVANTAGE (KITS) ×2 IMPLANT
PACK ANGIOGRAPHY (CUSTOM PROCEDURE TRAY) ×2 IMPLANT
SHEATH BRITE TIP 5FRX11 (SHEATH) ×2 IMPLANT
SHEATH SHUTTLE SELECT 6F (SHEATH) ×2 IMPLANT
STENT XACT CAR 9-7X40X136 (Permanent Stent) ×2 IMPLANT
SYR MEDRAD MARK 7 150ML (SYRINGE) ×2 IMPLANT
TOWEL OR 17X26 4PK STRL BLUE (TOWEL DISPOSABLE) ×4 IMPLANT
TUBING CONTRAST HIGH PRESS 72 (TUBING) ×2 IMPLANT
WIRE G VAS 035X260 STIFF (WIRE) ×2 IMPLANT
WIRE GUIDERIGHT .035X150 (WIRE) ×2 IMPLANT

## 2020-12-22 NOTE — Progress Notes (Signed)
BP remains 86-88/50's, HR 59-60 despite NS bolus 500 ml. Spoke with Linus Salmons, NP. Orders for 2nd bolus NS; started now. Pt. Asymptomatic.

## 2020-12-22 NOTE — Op Note (Signed)
OPERATIVE NOTE DATE: 12/22/2020  PROCEDURE: 1.  Ultrasound guidance for vascular access right femoral artery 2.  Placement of a 9 mm proximal by 7 mm distal 4 cm long Exact stent with the use of the NAV-6 embolic protection device in the left carotid artery  PRE-OPERATIVE DIAGNOSIS: 1. Left carotid artery stenosis. 2. Amaurosis fugax  POST-OPERATIVE DIAGNOSIS:  Same as above  SURGEON: Leotis Pain, MD  ASSISTANT(S):  none  ANESTHESIA: local/MCS  ESTIMATED BLOOD LOSS:  10 cc  CONTRAST: 50 cc  FLUORO TIME: 4.3 minutes  MODERATE CONSCIOUS SEDATION TIME:  Approximately 37 minutes using 3 mg of Versed and 50 mcg of Fentanyl  FINDING(S): 1.   90-95% left internal carotid artery stenosis  SPECIMEN(S):   none  INDICATIONS:   Patient is a 75 y.o. male who presents with 2 episodes of amaurosis fugax of the left eye and an ultrasound suggesting at least moderate left carotid artery stenosis.  The patient is brought in for angiography for further evaluation and potential treatment of the anatomy is appropriate for carotid stenting.  Risks and benefits were discussed and informed consent was obtained.   DESCRIPTION: After obtaining full informed written consent, the patient was brought back to the vascular suite and placed supine upon the table.  The patient received IV antibiotics prior to induction. Moderate conscious sedation was administered during a face to face encounter with the patient throughout the procedure with my supervision of the RN administering medicines and monitoring the patients vital signs and mental status throughout from the start of the procedure until the patient was taken to the recovery room.  After obtaining adequate anesthesia, the patient was prepped and draped in the standard fashion.   The right femoral artery was visualized with ultrasound and found to be widely patent. It was then accessed under direct ultrasound guidance without difficulty with a Seldinger  needle. A permanent image was recorded. A J-wire was placed and we then placed a 6 French sheath. The patient was then heparinized and a total of 8000 units of intravenous heparin were given and an ACT was checked to confirm successful anticoagulation. A pigtail catheter was then placed into the ascending aorta. This showed a type II aortic arch with normal origins of the great vessels. I then selectively cannulated the left common carotid artery without difficulty with a headhunter catheter and advanced into the mid left common carotid artery.  Cervical and cerebral carotid angiography was then performed. There were no obvious intracranial filling defects with fairly poor filling intracranially particularly in the anterior cerebral artery. The carotid bifurcation demonstrated a very tight 90 to 95% stenosis of the internal carotid artery 1 to 2 cm beyond the origin of the internal carotid artery.  I then advanced into the external carotid artery with a Glidewire and the headhunter catheter and then exchanged for the Amplatz Super Stiff wire. Over the Amplatz Super Stiff wire, a 6 [place] shuttle sheath was placed into the mid common carotid artery. I then used the NAV-6  Embolic protection device and crossed the lesion and parked this in the distal internal carotid artery at the base of the skull.  I then selected a 9 mm proximal, 7 mm distal 4 cm long exact stent. This was deployed across the lesion encompassing it in its entirety. A 4.5 mm diameter by 2 cm length balloon was used to post dilate the stent. Only about a 10-15% residual stenosis was present after angioplasty. Completion angiogram showed normal intracranial  filling without new defects. At this point I elected to terminate the procedure. The sheath was removed and StarClose closure device was deployed in the right femoral artery with excellent hemostatic result. The patient was taken to the recovery room in stable condition having tolerated the  procedure well.  COMPLICATIONS: none  CONDITION: stable  Leotis Pain 12/22/2020 10:16 AM   This note was created with Dragon Medical transcription system. Any errors in dictation are purely unintentional.

## 2020-12-22 NOTE — Interval H&P Note (Signed)
History and Physical Interval Note:  12/22/2020 9:01 AM  Seth Holt  has presented today for surgery, with the diagnosis of LT Carotid Stent   LT Carotid Artery Stenosis   ABBOTT Rep cc: M Godley, S Willey   Pt to have Covid test on 1-21.  The various methods of treatment have been discussed with the patient and family. After consideration of risks, benefits and other options for treatment, the patient has consented to  Procedure(s): CAROTID PTA/STENT INTERVENTION (Left) as a surgical intervention.  The patient's history has been reviewed, patient examined, no change in status, stable for surgery.  I have reviewed the patient's chart and labs.  Questions were answered to the patient's satisfaction.     Leotis Pain

## 2020-12-22 NOTE — Progress Notes (Signed)
Pt. C/o slight blurry vision "just starting." BP remains in the 90's. Juanell Fairly made aware ; she spoke with Dr. Lucky Cowboy. New orders received for low dose dopamine.

## 2020-12-23 ENCOUNTER — Encounter: Payer: Self-pay | Admitting: Vascular Surgery

## 2020-12-23 DIAGNOSIS — I6522 Occlusion and stenosis of left carotid artery: Principal | ICD-10-CM

## 2020-12-23 LAB — BASIC METABOLIC PANEL
Anion gap: 9 (ref 5–15)
BUN: 13 mg/dL (ref 8–23)
CO2: 25 mmol/L (ref 22–32)
Calcium: 8.4 mg/dL — ABNORMAL LOW (ref 8.9–10.3)
Chloride: 105 mmol/L (ref 98–111)
Creatinine, Ser: 0.86 mg/dL (ref 0.61–1.24)
GFR, Estimated: >60 mL/min (ref >60)
Glucose, Bld: 100 mg/dL — ABNORMAL HIGH (ref 70–99)
Potassium: 3.9 mmol/L (ref 3.5–5.1)
Sodium: 139 mmol/L (ref 135–145)

## 2020-12-23 LAB — CBC
HCT: 38.7 % — ABNORMAL LOW (ref 39.0–52.0)
Hemoglobin: 13.5 g/dL (ref 13.0–17.0)
MCH: 31.3 pg (ref 26.0–34.0)
MCHC: 34.9 g/dL (ref 30.0–36.0)
MCV: 89.8 fL (ref 80.0–100.0)
Platelets: 213 10*3/uL (ref 150–400)
RBC: 4.31 MIL/uL (ref 4.22–5.81)
RDW: 12.5 % (ref 11.5–15.5)
WBC: 11.1 10*3/uL — ABNORMAL HIGH (ref 4.0–10.5)
nRBC: 0 % (ref 0.0–0.2)

## 2020-12-23 MED ORDER — ASPIRIN 81 MG PO TBEC: 81.0000 mg | DELAYED_RELEASE_TABLET | Freq: Every day | ORAL | 3 refills | Status: DC

## 2020-12-23 MED ORDER — CLOPIDOGREL BISULFATE 75 MG PO TABS: 75.0000 mg | ORAL_TABLET | Freq: Every day | ORAL | 3 refills | Status: DC

## 2020-12-23 NOTE — Discharge Summary (Signed)
[facility]    Discharge Summary  Patient ID:  Seth Holt MRN: [id number] DOB/AGE: 06-22-1946 75 y.o.  Admit date: 12/22/2020 Discharge date: 12/23/2020 Date of Surgery: 12/22/2020 Surgeon: Surgeon(s): Algernon Huxley, MD  Admission Diagnosis: Carotid stenosis, left [I65.22]  Discharge Diagnoses:  Carotid stenosis, left [I65.22]  Secondary Diagnoses: Past Medical History:  Diagnosis Date  . Asthma    enviromental allergies  . Cancer ([place])   . GERD (gastroesophageal reflux disease)   . HOH (hard of hearing)    AIDS  . Vertigo    12/22/20 1.  Ultrasound guidance for vascular access right femoral artery 2.  Placement of a 9 mm proximal by 7 mm distal 4 cm long Exact stent with the use of the NAV-6 embolic protection device in the left carotid artery  HPI / Hospital Course:  Patient is a 75 year old male who presents with two episodes of amaurosis fugax of the left eye and an ultrasound suggesting at least moderate left carotid artery stenosis.  The patient is brought in for angiography for further evaluation and potential treatment of the anatomy is appropriate for carotid stenting. Risks and benefits were discussed and informed consent was obtained.   On 12/22/20 the patient underwent: 1) Ultrasound guidance for vascular access right femoral artery 2) Placement of a 9 mm proximal by 7 mm distal 4 cm long Exact stent with the use of the NAV-6 embolic protection device in the left carotid artery  The patient tolerated the procedure well was transferred from the angiography suite to the ICU for observation overnight.  Post procedure, the patient briefly needed some pressure support with dopamine.  This was discontinued the following morning.  Otherwise, the patient denied of procedure was unremarkable.  During his brief stay, his diet was advanced, he was urinating independently, his discomfort was controlled to the use of p.o. pain medication he was  ambulating at baseline.  Day of discharge, the patient was afebrile with stable vital signs and essentially unremarkable physical exam.  Physical exam:  Alert and oriented x3, no acute distress Face: Symmetrical, tongue midline Neck: Trachea midline, no swelling Cardiovascular: Regular rate and rhythm Pulmonary: Clear to auscultation bilaterally Abdomen: Soft, nontender, nondistended positive bowel sounds Right groin: Access site clean dry intact Extremity: Warm distally toes.  Good capillary refill. Neuro: No deficits on exam.  Strength 5/5.  Motor/sensory is intact.  Labs: As below  Complications: None  Consults: None  Significant Diagnostic Studies: CBC Lab Results  Component Value Date   WBC 11.1 (H) 12/23/2020   HGB 13.5 12/23/2020   HCT 38.7 (L) 12/23/2020   MCV 89.8 12/23/2020   PLT 213 12/23/2020   BMET    Component Value Date/Time   NA 139 12/23/2020 0453   K 3.9 12/23/2020 0453   CL 105 12/23/2020 0453   CO2 25 12/23/2020 0453   GLUCOSE 100 (H) 12/23/2020 0453   BUN 13 12/23/2020 0453   CREATININE 0.86 12/23/2020 0453   CALCIUM 8.4 (L) 12/23/2020 0453   GFRNONAA >60 12/23/2020 0453   GFRAA >60 08/20/2016 0628   COAG Lab Results  Component Value Date   INR 1.02 08/04/2016   Disposition:  Discharge to :Home  Allergies as of 12/23/2020   No Known Allergies     Medication List    STOP taking these medications   aspirin 325 MG tablet Replaced by: aspirin 81 MG EC tablet     TAKE these medications   acetaminophen  500 MG chewable tablet Commonly known as: TYLENOL Chew 1,000 mg by mouth every 6 (six) hours as needed for pain.   aspirin 81 MG EC tablet Take 1 tablet (81 mg total) by mouth daily. Swallow whole. Start taking on: December 24, 2020 Replaces: aspirin 325 MG tablet   atorvastatin 10 MG tablet Commonly known as: Lipitor Take 1 tablet (10 mg total) by mouth daily.   calcium carbonate 750 MG chewable tablet Commonly known as: TUMS  EX Chew 1-2 tablets by mouth daily as needed for heartburn.   chlorpheniramine 4 MG tablet Commonly known as: CHLOR-TRIMETON Take 4 mg by mouth 2 (two) times daily as needed for allergies.   clopidogrel 75 MG tablet Commonly known as: PLAVIX Take 1 tablet (75 mg total) by mouth daily.   diphenhydrAMINE 25 MG tablet Commonly known as: BENADRYL Take 25 mg by mouth at bedtime.   glucosamine-chondroitin 500-400 MG tablet Take 1 tablet by mouth 3 (three) times daily.   HYALURONIC ACID PO Take 400 mg by mouth daily. 200 mg per cap   multivitamin capsule Take 1 capsule by mouth daily.   naproxen 375 MG tablet Commonly known as: NAPROSYN Take 375 mg by mouth 2 (two) times daily as needed.   omega-3 acid ethyl esters 1 g capsule Commonly known as: LOVAZA Take 1 g by mouth daily.   PRESCRIPTION MEDICATION Place 1 drop into the left eye 2 (two) times daily. Prednisolone 10mg  gatifloxacin 5mg  bromfenac 0.75mg       Verbal and written Discharge instructions given to the patient. Wound care per Discharge AVS  Follow-up Information    Kris Hartmann, NP Follow up in 1 month(s).   Specialty: Vascular Surgery Why: First post-op visit. Will need carotid duplex with visit.  Contact information: [place] [address] [phone]              Signed: Sela Hua, PA-C  12/23/2020, 2:16 PM

## 2020-12-23 NOTE — Discharge Instructions (Signed)
Vascular Surgery Discharge Instructions: You may shower as of tomorrow.  Please keep your groins clean and dry.  Gently clean your groin with soap and water.  Gently pat dry. Please not engage in strenuous activity or lift greater than 10 pounds for at least 2 weeks

## 2020-12-30 DIAGNOSIS — M1712 Unilateral primary osteoarthritis, left knee: Secondary | ICD-10-CM | POA: Diagnosis not present

## 2021-01-21 ENCOUNTER — Other Ambulatory Visit (INDEPENDENT_AMBULATORY_CARE_PROVIDER_SITE_OTHER): Payer: Self-pay | Admitting: Vascular Surgery

## 2021-01-21 DIAGNOSIS — I6523 Occlusion and stenosis of bilateral carotid arteries: Secondary | ICD-10-CM

## 2021-01-21 DIAGNOSIS — Z95820 Peripheral vascular angioplasty status with implants and grafts: Secondary | ICD-10-CM

## 2021-01-23 ENCOUNTER — Ambulatory Visit (INDEPENDENT_AMBULATORY_CARE_PROVIDER_SITE_OTHER): Payer: PPO | Admitting: Nurse Practitioner

## 2021-01-23 ENCOUNTER — Other Ambulatory Visit: Payer: Self-pay

## 2021-01-23 ENCOUNTER — Encounter (INDEPENDENT_AMBULATORY_CARE_PROVIDER_SITE_OTHER): Payer: Self-pay | Admitting: Nurse Practitioner

## 2021-01-23 ENCOUNTER — Ambulatory Visit (INDEPENDENT_AMBULATORY_CARE_PROVIDER_SITE_OTHER): Payer: PPO

## 2021-01-23 VITALS — BP 153/86 | HR 76 | Resp 16 | Wt 231.0 lb

## 2021-01-23 DIAGNOSIS — Z95820 Peripheral vascular angioplasty status with implants and grafts: Secondary | ICD-10-CM

## 2021-01-23 DIAGNOSIS — I6523 Occlusion and stenosis of bilateral carotid arteries: Secondary | ICD-10-CM

## 2021-01-23 DIAGNOSIS — I6522 Occlusion and stenosis of left carotid artery: Secondary | ICD-10-CM

## 2021-01-23 DIAGNOSIS — G453 Amaurosis fugax: Secondary | ICD-10-CM

## 2021-01-23 DIAGNOSIS — E785 Hyperlipidemia, unspecified: Secondary | ICD-10-CM

## 2021-01-23 NOTE — Progress Notes (Signed)
Subjective:    Patient ID: Seth Holt, male    DOB: [birth year], 75 y.o.   MRN: [id number] Chief Complaint  Patient presents with  . Follow-up    [facility] 14month post left carotid stenosis    The patient is seen for follow up evaluation of carotid stenosis status post left carotid stent placement on 01/24/022.  Prior to intervention patient was having episodes of amaurosis fugax.  There were no post operative problems or complications related to the surgery.  The patient denies any groin pain or issues.  The patient denies interval amaurosis fugax post stent placement. There is no recent history of TIA symptoms or focal motor deficits. There is no prior documented CVA.  The patient denies headache.  The patient is taking enteric-coated aspirin 81 mg daily.  The patient has a history of coronary artery disease, no recent episodes of angina or shortness of breath. The patient denies PAD or claudication symptoms. There is a history of hyperlipidemia which is being treated with a statin.   Today bilateral velocities are consistent with a 1 to 39% stenosis.  The left ICA stent is patent.  The bilateral vertebral arteries demonstrate antegrade flow with normal flow hemodynamics in the bilateral subclavian arteries.   Review of Systems  Eyes: Negative for visual disturbance.  All other systems reviewed and are negative.      Objective:   Physical Exam Vitals reviewed.  HENT:     Head: Normocephalic.  Neck:     Vascular: No carotid bruit.  Cardiovascular:     Rate and Rhythm: Normal rate.     Pulses: Normal pulses.  Pulmonary:     Effort: Pulmonary effort is normal.  Neurological:     Mental Status: He is alert and oriented to person, place, and time.  Psychiatric:        Mood and Affect: Mood normal.        Behavior: Behavior normal.        Thought Content: Thought content normal.        Judgment: Judgment normal.     BP (!) 153/86 (BP Location: Right Arm)   Pulse 76    Resp 16   Wt 231 lb (104.8 kg)   BMI 31.33 kg/m   Past Medical History:  Diagnosis Date  . Asthma    enviromental allergies  . Cancer ([place])   . GERD (gastroesophageal reflux disease)   . HOH (hard of hearing)    AIDS  . Vertigo     Social History   Socioeconomic History  . Marital status: Married    Spouse name: Not on file  . Number of children: Not on file  . Years of education: Not on file  . Highest education level: Not on file  Occupational History  . Not on file  Tobacco Use  . Smoking status: Light Tobacco Smoker    Types: Pipe  . Smokeless tobacco: Never Used  Substance and Sexual Activity  . Alcohol use: No  . Drug use: No  . Sexual activity: Not on file  Other Topics Concern  . Not on file  Social History Narrative  . Not on file   Social Determinants of Health   Financial Resource Strain: Not on file  Food Insecurity: Not on file  Transportation Needs: Not on file  Physical Activity: Not on file  Stress: Not on file  Social Connections: Not on file  Intimate Partner Violence: Not on file    Past Surgical  History:  Procedure Laterality Date  . CAROTID PTA/STENT INTERVENTION Left 12/22/2020   Procedure: CAROTID PTA/STENT INTERVENTION;  Surgeon: Algernon Huxley, MD;  Location: [facility];  Service: Cardiovascular;  Laterality: Left;  . CATARACT EXTRACTION W/PHACO Left 04/06/2018   Procedure: CATARACT EXTRACTION PHACO AND INTRAOCULAR LENS PLACEMENT (IOC);  Surgeon: Eulogio Bear, MD;  Location: [facility];  Service: Ophthalmology;  Laterality: Left;  [place] 00:19.8 AP% 5.8 CDE 1.15 Fluid pack lot # [id number]  . CATARACT EXTRACTION W/PHACO Right 05/04/2018   Procedure: CATARACT EXTRACTION PHACO AND INTRAOCULAR LENS PLACEMENT (IOC);  Surgeon: Eulogio Bear, MD;  Location: [facility];  Service: Ophthalmology;  Laterality: Right;  fluid pack lot # [id number]  exp 12/29/2018 [place] 00:27.7 AP% 7.1 CDE 1.98  . COLONOSCOPY WITH PROPOFOL N/A 08/22/2019    Procedure: COLONOSCOPY WITH PROPOFOL;  Surgeon: Toledo, Benay Pike, MD;  Location: [facility];  Service: Gastroenterology;  Laterality: N/A;  . EYE SURGERY    . JOINT REPLACEMENT     TKR  . KNEE ARTHROPLASTY Right 08/18/2016   Procedure: COMPUTER ASSISTED TOTAL KNEE ARTHROPLASTY;  Surgeon: Dereck Leep, MD;  Location: [facility];  Service: Orthopedics;  Laterality: Right;  . LACERATION REPAIR Left    thumb    Family History  Problem Relation Age of Onset  . Colon cancer Father   . Hypertension Father   . Arthritis Father     No Known Allergies  CBC Latest Ref Rng & Units 12/23/2020 08/20/2016 08/19/2016  WBC 4.0 - 10.5 K/uL 11.1(H) 8.4 11.4(H)  Hemoglobin 13.0 - 17.0 g/dL 13.5 11.5(L) 12.2(L)  Hematocrit 39.0 - 52.0 % 38.7(L) 32.1(L) 35.0(L)  Platelets 150 - 400 K/uL 213 183 194      CMP     Component Value Date/Time   NA 139 12/23/2020 0453   K 3.9 12/23/2020 0453   CL 105 12/23/2020 0453   CO2 25 12/23/2020 0453   GLUCOSE 100 (H) 12/23/2020 0453   BUN 13 12/23/2020 0453   CREATININE 0.86 12/23/2020 0453   CALCIUM 8.4 (L) 12/23/2020 0453   PROT 7.8 08/04/2016 1541   ALBUMIN 4.8 08/04/2016 1541   AST 22 08/04/2016 1541   ALT 19 08/04/2016 1541   ALKPHOS 56 08/04/2016 1541   BILITOT 0.4 08/04/2016 1541   GFRNONAA >60 12/23/2020 0453   GFRAA >60 08/20/2016 0628     No results found.     Assessment & Plan:   1. Carotid stenosis, left Recommend:  The patient is s/p successful left ICA stenting  Duplex ultrasound preoperatively shows <50% contralateral stenosis.  Continue antiplatelet therapy as prescribed Continue management of CAD, HTN and Hyperlipidemia Healthy heart diet,  encouraged exercise at least 4 times per week  Follow up in 3 months with duplex ultrasound and physical exam based on the patient's carotid surgery    2. Amaurosis fugax This has completely resolved and the patient has had no recurrent episodes.  3. Hyperlipidemia, unspecified  hyperlipidemia type Continue statin as ordered and reviewed, no changes at this time    Current Outpatient Medications on File Prior to Visit  Medication Sig Dispense Refill  . acetaminophen (TYLENOL) 500 MG chewable tablet Chew 1,000 mg by mouth every 6 (six) hours as needed for pain.     Marland Kitchen aspirin EC 81 MG EC tablet Take 1 tablet (81 mg total) by mouth daily. Swallow whole. 90 tablet 3  . atorvastatin (LIPITOR) 10 MG tablet Take 1 tablet (10 mg total) by mouth daily.  30 tablet 11  . calcium carbonate (TUMS EX) 750 MG chewable tablet Chew 1-2 tablets by mouth daily as needed for heartburn.     . chlorpheniramine (CHLOR-TRIMETON) 4 MG tablet Take 4 mg by mouth 2 (two) times daily as needed for allergies.    Marland Kitchen clopidogrel (PLAVIX) 75 MG tablet Take 1 tablet (75 mg total) by mouth daily. 90 tablet 3  . diphenhydrAMINE (BENADRYL) 25 MG tablet Take 25 mg by mouth at bedtime.    Marland Kitchen Hyaluronic Acid-Vitamin C (HYALURONIC ACID PO) Take 400 mg by mouth daily. 200 mg per cap    . Multiple Vitamin (MULTIVITAMIN) capsule Take 1 capsule by mouth daily.    Marland Kitchen PRESCRIPTION MEDICATION Place 1 drop into the left eye 2 (two) times daily. Prednisolone 10mg  gatifloxacin 5mg  bromfenac 0.75mg     . glucosamine-chondroitin 500-400 MG tablet Take 1 tablet by mouth 3 (three) times daily. (Patient not taking: No sig reported)    . naproxen (NAPROSYN) 375 MG tablet Take 375 mg by mouth 2 (two) times daily as needed. (Patient not taking: No sig reported)    . omega-3 acid ethyl esters (LOVAZA) 1 g capsule Take 1 g by mouth daily. (Patient not taking: No sig reported)     No current facility-administered medications on file prior to visit.    There are no Patient Instructions on file for this visit. No follow-ups on file.   Kris Hartmann, NP

## 2021-04-13 DIAGNOSIS — M1712 Unilateral primary osteoarthritis, left knee: Secondary | ICD-10-CM | POA: Diagnosis not present

## 2021-04-23 ENCOUNTER — Other Ambulatory Visit (INDEPENDENT_AMBULATORY_CARE_PROVIDER_SITE_OTHER): Payer: Self-pay | Admitting: Nurse Practitioner

## 2021-04-23 DIAGNOSIS — I6522 Occlusion and stenosis of left carotid artery: Secondary | ICD-10-CM

## 2021-04-28 ENCOUNTER — Encounter (INDEPENDENT_AMBULATORY_CARE_PROVIDER_SITE_OTHER): Payer: Self-pay | Admitting: Vascular Surgery

## 2021-04-28 ENCOUNTER — Other Ambulatory Visit: Payer: Self-pay

## 2021-04-28 ENCOUNTER — Ambulatory Visit (INDEPENDENT_AMBULATORY_CARE_PROVIDER_SITE_OTHER): Payer: PPO | Admitting: Vascular Surgery

## 2021-04-28 ENCOUNTER — Ambulatory Visit (INDEPENDENT_AMBULATORY_CARE_PROVIDER_SITE_OTHER): Payer: PPO

## 2021-04-28 VITALS — BP 132/75 | HR 67 | Ht 71.0 in | Wt 229.0 lb

## 2021-04-28 DIAGNOSIS — I6523 Occlusion and stenosis of bilateral carotid arteries: Secondary | ICD-10-CM

## 2021-04-28 DIAGNOSIS — E785 Hyperlipidemia, unspecified: Secondary | ICD-10-CM | POA: Diagnosis not present

## 2021-04-28 DIAGNOSIS — I6522 Occlusion and stenosis of left carotid artery: Secondary | ICD-10-CM

## 2021-04-28 DIAGNOSIS — G453 Amaurosis fugax: Secondary | ICD-10-CM | POA: Diagnosis not present

## 2021-04-28 NOTE — Assessment & Plan Note (Signed)
His duplex today shows his carotid stent on the left to be widely patent and his right carotid artery velocities are in the 1 to 39% range.  Overall, he is doing well.  He says he will have an upcoming knee replacement if he needs to come off of his Plavix that is perfectly okay.  He can resume it after his surgery.  I will plan to see him back in 6 months with duplex.

## 2021-04-28 NOTE — Progress Notes (Signed)
MRN : [id number]  Seth Holt is a 74 y.o. (Sep 05, 1946) male who presents with chief complaint of  Chief Complaint  Patient presents with  . Follow-up    3 Mo Carotid  .  History of Present Illness: Patient returns in follow-up of his carotid disease.  He is now 4 months status post left carotid artery stent placement for significant stenosis with amaurosis fugax.  He is doing well.  He has had no further neurologic events since his procedure.  He is tolerating aspirin and Plavix well with mild increase in bruising.  He has no complaints today.  His duplex today shows his carotid stent on the left to be widely patent and his right carotid artery velocities are in the 1 to 39% range.  Current Outpatient Medications  Medication Sig Dispense Refill  . acetaminophen (TYLENOL) 500 MG chewable tablet Chew 1,000 mg by mouth every 6 (six) hours as needed for pain.     Marland Kitchen aspirin EC 81 MG EC tablet Take 1 tablet (81 mg total) by mouth daily. Swallow whole. 90 tablet 3  . atorvastatin (LIPITOR) 10 MG tablet Take 1 tablet (10 mg total) by mouth daily. 30 tablet 11  . calcium carbonate (TUMS EX) 750 MG chewable tablet Chew 1-2 tablets by mouth daily as needed for heartburn.     . chlorpheniramine (CHLOR-TRIMETON) 4 MG tablet Take 4 mg by mouth 2 (two) times daily as needed for allergies.    Marland Kitchen clopidogrel (PLAVIX) 75 MG tablet Take 1 tablet (75 mg total) by mouth daily. 90 tablet 3  . diphenhydrAMINE (BENADRYL) 25 MG tablet Take 25 mg by mouth at bedtime.    Marland Kitchen glucosamine-chondroitin 500-400 MG tablet Take 1 tablet by mouth 3 (three) times daily.    Marland Kitchen Hyaluronic Acid-Vitamin C (HYALURONIC ACID PO) Take 400 mg by mouth daily. 200 mg per cap    . Multiple Vitamin (MULTIVITAMIN) capsule Take 1 capsule by mouth daily.    . naproxen (NAPROSYN) 375 MG tablet Take 375 mg by mouth 2 (two) times daily as needed.    Marland Kitchen omega-3 acid ethyl esters (LOVAZA) 1 g capsule Take 1 g by mouth daily.    Marland Kitchen  PRESCRIPTION MEDICATION Place 1 drop into the left eye 2 (two) times daily. Prednisolone 10mg  gatifloxacin 5mg  bromfenac 0.75mg      No current facility-administered medications for this visit.    Past Medical History:  Diagnosis Date  . Asthma    enviromental allergies  . Cancer ([place])   . GERD (gastroesophageal reflux disease)   . HOH (hard of hearing)    AIDS  . Vertigo     Past Surgical History:  Procedure Laterality Date  . CAROTID PTA/STENT INTERVENTION Left 12/22/2020   Procedure: CAROTID PTA/STENT INTERVENTION;  Surgeon: Algernon Huxley, MD;  Location: [facility];  Service: Cardiovascular;  Laterality: Left;  . CATARACT EXTRACTION W/PHACO Left 04/06/2018   Procedure: CATARACT EXTRACTION PHACO AND INTRAOCULAR LENS PLACEMENT (IOC);  Surgeon: Eulogio Bear, MD;  Location: [facility];  Service: Ophthalmology;  Laterality: Left;  [place] 00:19.8 AP% 5.8 CDE 1.15 Fluid pack lot # [id number]  . CATARACT EXTRACTION W/PHACO Right 05/04/2018   Procedure: CATARACT EXTRACTION PHACO AND INTRAOCULAR LENS PLACEMENT (IOC);  Surgeon: Eulogio Bear, MD;  Location: [facility];  Service: Ophthalmology;  Laterality: Right;  fluid pack lot # [id number] H  exp 12/29/2018 [place] 00:27.7 AP% 7.1 CDE 1.98  . COLONOSCOPY WITH PROPOFOL N/A 08/22/2019   Procedure: COLONOSCOPY  WITH PROPOFOL;  Surgeon: Toledo, Benay Pike, MD;  Location: [facility];  Service: Gastroenterology;  Laterality: N/A;  . EYE SURGERY    . JOINT REPLACEMENT     TKR  . KNEE ARTHROPLASTY Right 08/18/2016   Procedure: COMPUTER ASSISTED TOTAL KNEE ARTHROPLASTY;  Surgeon: Dereck Leep, MD;  Location: [facility];  Service: Orthopedics;  Laterality: Right;  . LACERATION REPAIR Left    thumb     Social History   Tobacco Use  . Smoking status: Light Tobacco Smoker    Types: Pipe  . Smokeless tobacco: Never Used  Substance Use Topics  . Alcohol use: No  . Drug use: No      Family History  Problem Relation Age of Onset  . Colon  cancer Father   . Hypertension Father   . Arthritis Father      No Known Allergies   REVIEW OF SYSTEMS (Negative unless checked)  Constitutional: [] ?Weight loss  [] ?Fever  [] ?Chills Cardiac: [] ?Chest pain   [] ?Chest pressure   [] ?Palpitations   [] ?Shortness of breath when laying flat   [] ?Shortness of breath at rest   [] ?Shortness of breath with exertion. Vascular:  [] ?Pain in legs with walking   [] ?Pain in legs at rest   [] ?Pain in legs when laying flat   [] ?Claudication   [] ?Pain in feet when walking  [] ?Pain in feet at rest  [] ?Pain in feet when laying flat   [] ?History of DVT   [] ?Phlebitis   [] ?Swelling in legs   [] ?Varicose veins   [] ?Non-healing ulcers Pulmonary:   [] ?Uses home oxygen   [] ?Productive cough   [] ?Hemoptysis   [] ?Wheeze  [] ?COPD   [x] ?Asthma Neurologic:  [] ?Dizziness  [] ?Blackouts   [] ?Seizures   [] ?History of stroke   [] ?History of TIA  [] ?Aphasia   [x] ?Temporary blindness   [] ?Dysphagia   [] ?Weakness or numbness in arms   [] ?Weakness or numbness in legs Musculoskeletal:  [x] ?Arthritis   [] ?Joint swelling   [x] ?Joint pain   [] ?Low back pain Hematologic:  [] ?Easy bruising  [] ?Easy bleeding   [] ?Hypercoagulable state   [] ?Anemic  [] ?Hepatitis Gastrointestinal:  [] ?Blood in stool   [] ?Vomiting blood  [x] ?Gastroesophageal reflux/heartburn   [] ?Abdominal pain Genitourinary:  [] ?Chronic kidney disease   [] ?Difficult urination  [] ?Frequent urination  [] ?Burning with urination   [] ?Hematuria Skin:  [] ?Rashes   [] ?Ulcers   [] ?Wounds Psychological:  [] ?History of anxiety   [] ? History of major depression.  Physical Examination  Vitals:   04/28/21 1024  BP: 132/75  Pulse: 67  Weight: 229 lb (103.9 kg)  Height: 5\' 11"  (1.803 m)   Body mass index is 31.94 kg/m. Gen:  WD/WN, NAD Head: [place]/AT, No temporalis wasting. Ear/Nose/Throat: Hearing grossly intact, nares w/o erythema or drainage, trachea midline Eyes: Conjunctiva clear. Sclera non-icteric Neck: Supple.  No bruit   Pulmonary:  Good air movement, equal and clear to auscultation bilaterally.  Cardiac: RRR, No JVD Vascular:  Vessel Right Left  Radial Palpable Palpable       Musculoskeletal: M/S 5/5 throughout.  No deformity or atrophy. No edema. Neurologic: CN 2-12 intact. Sensation grossly intact in extremities.  Symmetrical.  Speech is fluent. Motor exam as listed above. Psychiatric: Judgment intact, Mood & affect appropriate for pt's clinical situation. Dermatologic: No rashes or ulcers noted.  No cellulitis or open wounds.      CBC Lab Results  Component Value Date   WBC 11.1 (H) 12/23/2020   HGB 13.5 12/23/2020   HCT 38.7 (L) 12/23/2020   MCV 89.8  12/23/2020   PLT 213 12/23/2020    BMET    Component Value Date/Time   NA 139 12/23/2020 0453   K 3.9 12/23/2020 0453   CL 105 12/23/2020 0453   CO2 25 12/23/2020 0453   GLUCOSE 100 (H) 12/23/2020 0453   BUN 13 12/23/2020 0453   CREATININE 0.86 12/23/2020 0453   CALCIUM 8.4 (L) 12/23/2020 0453   GFRNONAA >60 12/23/2020 0453   GFRAA >60 08/20/2016 0628   CrCl cannot be calculated (Patient's most recent lab result is older than the maximum 21 days allowed.).  COAG Lab Results  Component Value Date   INR 1.02 08/04/2016    Radiology No results found.   Assessment/Plan Amaurosis fugax 2 episodes.  Likely from his carotid disease.  Now s/p intervention without recurrence  Hyperlipidemia lipid control important in reducing the progression of atherosclerotic disease. Start statin therapy both to treat his hyperlipidemia as well as to stabilize the plaque prior to carotid intervention.  Carotid stenosis His duplex today shows his carotid stent on the left to be widely patent and his right carotid artery velocities are in the 1 to 39% range.  Overall, he is doing well.  He says he will have an upcoming knee replacement if he needs to come off of his Plavix that is perfectly okay.  He can resume it after his surgery.  I will plan  to see him back in 6 months with duplex.    Leotis Pain, MD  04/28/2021 11:20 AM    This note was created with Dragon medical transcription system.  Any errors from dictation are purely unintentional

## 2021-06-16 DIAGNOSIS — Z96651 Presence of right artificial knee joint: Secondary | ICD-10-CM | POA: Diagnosis not present

## 2021-06-16 DIAGNOSIS — M1712 Unilateral primary osteoarthritis, left knee: Secondary | ICD-10-CM | POA: Diagnosis not present

## 2021-06-21 DIAGNOSIS — M1712 Unilateral primary osteoarthritis, left knee: Secondary | ICD-10-CM | POA: Insufficient documentation

## 2021-07-04 ENCOUNTER — Other Ambulatory Visit (INDEPENDENT_AMBULATORY_CARE_PROVIDER_SITE_OTHER): Payer: Self-pay | Admitting: Vascular Surgery

## 2021-07-19 NOTE — Discharge Instructions (Signed)
Instructions after Total Knee Replacement   Jalin Erpelding P. Vickki Igou, Jr., M.D.     Dept. of Orthopaedics & Sports Medicine  [facility]  [address]  [place], [place]  [address]  Phone: [phone]   Fax: [phone]    DIET: Drink plenty of non-alcoholic fluids. Resume your normal diet. Include foods high in fiber.  ACTIVITY:  You may use crutches or a walker with weight-bearing as tolerated, unless instructed otherwise. You may be weaned off of the walker or crutches by your Physical Therapist.  Do NOT place pillows under the knee. Anything placed under the knee could limit your ability to straighten the knee.   Continue doing gentle exercises. Exercising will reduce the pain and swelling, increase motion, and prevent muscle weakness.   Please continue to use the TED compression stockings for 6 weeks. You may remove the stockings at night, but should reapply them in the morning. Do not drive or operate any equipment until instructed.  WOUND CARE:  Continue to use the PolarCare or ice packs periodically to reduce pain and swelling. You may bathe or shower after the staples are removed at the first office visit following surgery.  MEDICATIONS: You may resume your regular medications. Please take the pain medication as prescribed on the medication. Do not take pain medication on an empty stomach. You have been given a prescription for a blood thinner (Lovenox or Coumadin). Please take the medication as instructed. (NOTE: After completing a 2 week course of Lovenox, take one Enteric-coated aspirin once a day. This along with elevation will help reduce the possibility of phlebitis in your operated leg.) Do not drive or drink alcoholic beverages when taking pain medications.  CALL THE OFFICE FOR: Temperature above 101 degrees Excessive bleeding or drainage on the dressing. Excessive swelling, coldness, or paleness of the toes. Persistent nausea and vomiting.  FOLLOW-UP:  You  should have an appointment to return to the office in 10-14 days after surgery. Arrangements have been made for continuation of Physical Therapy (either home therapy or outpatient therapy).   [facility] Department Directory         [email or web address]       [email or web address]          Cardiology  Appointments: [place] - [phone] [place] - [phone]  Endocrinology  Appointments: [place] - [phone] [place] - [phone]  Gastroenterology  Appointments: [place] - [phone] [place] - [phone]        General Surgery   Appointments: [place] - [phone]  Internal Medicine/Family Medicine  Appointments: [place] - [phone] [place] - [phone] [place] - [phone]  Metabolic and Weigh Loss Surgery  Appointments: [place] - [phone]        Neurology  Appointments: [place] - [phone] [place] - [phone]  Neurosurgery  Appointments: [place] - [phone]  Obstetrics & Gynecology  Appointments: [place] - [phone] [place] - [phone]        Pediatrics  Appointments: [place] - [phone] [place] - [phone]  Physiatry  Appointments: [place] -[phone]  Physical Therapy  Appointments: [place] - [phone] [place] - [phone]        Podiatry  Appointments: [place] - [phone] [place] - [phone]  Pulmonology  Appointments: [place] - [phone]  Rheumatology  Appointments: [place] - [phone]        [place] Location: [facility]  [address] [place], [place]  [address]  [place] Location: [facility] [address] [place], [place]  [address]  [place] Location: [facility] [address] [place],   [address]    

## 2021-08-04 ENCOUNTER — Telehealth (INDEPENDENT_AMBULATORY_CARE_PROVIDER_SITE_OTHER): Payer: Self-pay

## 2021-08-04 NOTE — Telephone Encounter (Signed)
Returning patient call from message on the nurse line

## 2021-08-05 NOTE — Telephone Encounter (Signed)
Patient was made aware with medical advice and will reach out to Dr [facility] office

## 2021-08-05 NOTE — Telephone Encounter (Signed)
Patient should have clearance form sent over by Dr. Clydell Hakim office

## 2021-08-11 ENCOUNTER — Other Ambulatory Visit: Payer: Self-pay

## 2021-08-11 ENCOUNTER — Encounter
Admission: RE | Admit: 2021-08-11 | Discharge: 2021-08-11 | Disposition: A | Discharge status: Home / Self Care | Payer: PPO | Source: Ambulatory Visit | Attending: Orthopedic Surgery | Admitting: Orthopedic Surgery

## 2021-08-11 DIAGNOSIS — Z01818 Encounter for other preprocedural examination: Secondary | ICD-10-CM | POA: Diagnosis not present

## 2021-08-11 DIAGNOSIS — Z01810 Encounter for preprocedural cardiovascular examination: Secondary | ICD-10-CM | POA: Diagnosis not present

## 2021-08-11 DIAGNOSIS — M1712 Unilateral primary osteoarthritis, left knee: Secondary | ICD-10-CM | POA: Diagnosis not present

## 2021-08-11 HISTORY — DX: Cerebral infarction, unspecified: I63.9

## 2021-08-11 LAB — CBC
HCT: 43.3 % (ref 39.0–52.0)
Hemoglobin: 15.1 g/dL (ref 13.0–17.0)
MCH: 31.7 pg (ref 26.0–34.0)
MCHC: 34.9 g/dL (ref 30.0–36.0)
MCV: 91 fL (ref 80.0–100.0)
Platelets: 229 10*3/uL (ref 150–400)
RBC: 4.76 MIL/uL (ref 4.22–5.81)
RDW: 12.6 % (ref 11.5–15.5)
WBC: 7.8 10*3/uL (ref 4.0–10.5)
nRBC: 0 % (ref 0.0–0.2)

## 2021-08-11 LAB — URINALYSIS, ROUTINE W REFLEX MICROSCOPIC
Bilirubin Urine: NEGATIVE
Glucose, UA: NEGATIVE mg/dL
Hgb urine dipstick: NEGATIVE
Ketones, ur: NEGATIVE mg/dL
Leukocytes,Ua: NEGATIVE
Nitrite: NEGATIVE
Protein, ur: NEGATIVE mg/dL
Specific Gravity, Urine: 1.013 (ref 1.005–1.030)
pH: 6 (ref 5.0–8.0)

## 2021-08-11 LAB — TYPE AND SCREEN
ABO/RH(D): B POS
Antibody Screen: NEGATIVE

## 2021-08-11 LAB — APTT: aPTT: 34 seconds (ref 24–36)

## 2021-08-11 LAB — COMPREHENSIVE METABOLIC PANEL
ALT: 15 U/L (ref 0–44)
AST: 18 U/L (ref 15–41)
Albumin: 4.4 g/dL (ref 3.5–5.0)
Alkaline Phosphatase: 52 U/L (ref 38–126)
Anion gap: 8 (ref 5–15)
BUN: 10 mg/dL (ref 8–23)
CO2: 27 mmol/L (ref 22–32)
Calcium: 9.3 mg/dL (ref 8.9–10.3)
Chloride: 103 mmol/L (ref 98–111)
Creatinine, Ser: 0.79 mg/dL (ref 0.61–1.24)
GFR, Estimated: >60 mL/min (ref >60)
Glucose, Bld: 89 mg/dL (ref 70–99)
Potassium: 3.7 mmol/L (ref 3.5–5.1)
Sodium: 138 mmol/L (ref 135–145)
Total Bilirubin: 0.7 mg/dL (ref 0.3–1.2)
Total Protein: 7.1 g/dL (ref 6.5–8.1)

## 2021-08-11 LAB — SEDIMENTATION RATE: Sed Rate: 9 mm/hr (ref 0–20)

## 2021-08-11 LAB — SURGICAL PCR SCREEN
MRSA, PCR: NEGATIVE
Staphylococcus aureus: NEGATIVE

## 2021-08-11 LAB — PROTIME-INR
INR: 1.1 (ref 0.8–1.2)
Prothrombin Time: 13.7 seconds (ref 11.4–15.2)

## 2021-08-11 LAB — C-REACTIVE PROTEIN: CRP: 0.8 mg/dL (ref <1.0)

## 2021-08-11 NOTE — Patient Instructions (Addendum)
Your procedure is scheduled on: 08[place] To find out your arrival time please call [phone] between 1PM - 3PM on 08/20/21.  Remember: Instructions that are not followed completely may result in serious medical risk, up to and including death, or upon the discretion of your surgeon and anesthesiologist your surgery may need to be rescheduled.     _X__ 1. Do not eat food after midnight the night before your procedure.                 No gum chewing or hard candies. You may drink clear liquids up to 2 hours                 before you are scheduled to arrive for your surgery- DO not drink clear                 liquids within 2 hours of the start of your surgery.                 Clear Liquids include:  water, apple juice without pulp, clear carbohydrate                 drink such as Clearfast or Gatorade, Black Coffee or Tea (Do not add                 anything to coffee or tea). Diabetics water only  __X__2.  On the morning of surgery brush your teeth with toothpaste and water, you                 may rinse your mouth with mouthwash if you wish.  Do not swallow any              toothpaste of mouthwash.     _X__ 3.  No Alcohol for 24 hours before or after surgery.   _X__ 4.  Do Not Smoke or use e-cigarettes For 24 Hours Prior to Your Surgery.                 Do not use any chewable tobacco products for at least 6 hours prior to                 surgery.  ____  5.  Bring all medications with you on the day of surgery if instructed.   __X__  6.  Notify your doctor if there is any change in your medical condition      (cold, fever, infections).     Do not wear jewelry, make-up, hairpins, clips or nail polish. Do not wear lotions, powders, or perfumes.  Do not shave body hair 48 hours prior to surgery. Men may shave face and neck. Do not bring valuables to the hospital.    [facility] is not responsible for any  belongings or valuables.  Contacts, dentures/partials or body piercings may not be worn into surgery. Bring a case for your contacts, glasses or hearing aids, a denture cup will be supplied. Leave your suitcase in the car. After surgery it may be brought to your room. For patients admitted to the hospital, discharge time is determined by your treatment team.   Patients discharged the day of surgery will not be allowed to drive home.   Please read over the following fact sheets that you were given:   MRSA Information, CHG soap, Incentive spirometer, Ensure drink  __X__ Take these medicines the morning of  surgery with A SIP OF WATER:    1. atorvastatin (LIPITOR) 10 MG tablet  2.   3.   4.  5.  6.  ____ Fleet Enema (as directed)   __X__ Use CHG Soap/SAGE wipes as directed  ____ Use inhalers on the day of surgery  ____ Stop metformin/Janumet/Farxiga 2 days prior to surgery    ____ Take 1/2 of usual insulin dose the night before surgery. No insulin the morning          of surgery.   __X__ Stop Blood Thinners. LAST DOSE OF PLAVIX WILL BE 08/13/21  __X__ Stop Anti-inflammatories 7 days before surgery such as Advil, Ibuprofen, Motrin,  BC or Goodies Powder, Naprosyn, Naproxen, Aleve   __X__ Stop all herbal and vitamin supplements, fish oil or vitamin E until after surgery.    ____ Bring C-Pap to the hospital.

## 2021-08-13 LAB — URINE CULTURE
Culture: NO GROWTH
Special Requests: NORMAL

## 2021-08-19 ENCOUNTER — Other Ambulatory Visit
Admission: RE | Admit: 2021-08-19 | Discharge: 2021-08-19 | Disposition: A | Discharge status: Home / Self Care | Payer: PPO | Source: Ambulatory Visit | Attending: Orthopedic Surgery | Admitting: Orthopedic Surgery

## 2021-08-19 ENCOUNTER — Other Ambulatory Visit: Payer: Self-pay

## 2021-08-19 DIAGNOSIS — Z20822 Contact with and (suspected) exposure to covid-19: Secondary | ICD-10-CM | POA: Diagnosis not present

## 2021-08-19 DIAGNOSIS — Z01812 Encounter for preprocedural laboratory examination: Secondary | ICD-10-CM | POA: Insufficient documentation

## 2021-08-19 LAB — SARS CORONAVIRUS 2 (TAT 6-24 HRS): SARS Coronavirus 2: NEGATIVE

## 2021-08-21 ENCOUNTER — Other Ambulatory Visit: Payer: Self-pay

## 2021-08-21 ENCOUNTER — Ambulatory Visit: Payer: PPO | Admitting: Anesthesiology

## 2021-08-21 ENCOUNTER — Encounter: Payer: Self-pay | Admitting: Orthopedic Surgery

## 2021-08-21 ENCOUNTER — Observation Stay
Admission: RE | Admit: 2021-08-21 | Discharge: 2021-08-22 | Disposition: A | Discharge status: Home / Self Care | Payer: PPO | Attending: Orthopedic Surgery | Admitting: Orthopedic Surgery

## 2021-08-21 ENCOUNTER — Observation Stay: Payer: PPO

## 2021-08-21 ENCOUNTER — Encounter
Admission: RE | Disposition: A | Discharge status: Home / Self Care | Payer: Self-pay | Source: Home / Self Care | Attending: Orthopedic Surgery

## 2021-08-21 ENCOUNTER — Ambulatory Visit: Payer: PPO | Admitting: Urgent Care

## 2021-08-21 DIAGNOSIS — K219 Gastro-esophageal reflux disease without esophagitis: Secondary | ICD-10-CM | POA: Diagnosis not present

## 2021-08-21 DIAGNOSIS — Z96659 Presence of unspecified artificial knee joint: Secondary | ICD-10-CM

## 2021-08-21 DIAGNOSIS — Z7902 Long term (current) use of antithrombotics/antiplatelets: Secondary | ICD-10-CM | POA: Insufficient documentation

## 2021-08-21 DIAGNOSIS — Z96651 Presence of right artificial knee joint: Secondary | ICD-10-CM | POA: Diagnosis not present

## 2021-08-21 DIAGNOSIS — Z7982 Long term (current) use of aspirin: Secondary | ICD-10-CM | POA: Insufficient documentation

## 2021-08-21 DIAGNOSIS — Z96652 Presence of left artificial knee joint: Secondary | ICD-10-CM | POA: Diagnosis not present

## 2021-08-21 DIAGNOSIS — Z87891 Personal history of nicotine dependence: Secondary | ICD-10-CM | POA: Diagnosis not present

## 2021-08-21 DIAGNOSIS — Z79899 Other long term (current) drug therapy: Secondary | ICD-10-CM | POA: Insufficient documentation

## 2021-08-21 DIAGNOSIS — J45909 Unspecified asthma, uncomplicated: Secondary | ICD-10-CM | POA: Insufficient documentation

## 2021-08-21 DIAGNOSIS — M1712 Unilateral primary osteoarthritis, left knee: Secondary | ICD-10-CM | POA: Diagnosis not present

## 2021-08-21 HISTORY — PX: KNEE ARTHROPLASTY: SHX992

## 2021-08-21 SURGERY — ARTHROPLASTY, KNEE, TOTAL, USING IMAGELESS COMPUTER-ASSISTED NAVIGATION
Anesthesia: Spinal | Site: Knee | Laterality: Left

## 2021-08-21 MED ORDER — PROPOFOL 500 MG/50ML IV EMUL
INTRAVENOUS | Status: DC | PRN
  Administered 2021-08-21: 75 ug/kg/min via INTRAVENOUS

## 2021-08-21 MED ORDER — ATORVASTATIN CALCIUM 10 MG PO TABS
10.0000 mg | ORAL_TABLET | Freq: Every day | ORAL | Status: DC
  Administered 2021-08-22: 10 mg via ORAL
  Filled 2021-08-21: qty 1

## 2021-08-21 MED ORDER — OXYCODONE HCL 5 MG PO TABS: 5.0000 mg | ORAL_TABLET | Freq: Once | ORAL | Status: DC | PRN

## 2021-08-21 MED ORDER — TRANEXAMIC ACID-NACL 1000-0.7 MG/100ML-% IV SOLN
1000.0000 mg | INTRAVENOUS | Status: AC
  Administered 2021-08-21: 1000 mg via INTRAVENOUS

## 2021-08-21 MED ORDER — CELECOXIB 200 MG PO CAPS
200.0000 mg | ORAL_CAPSULE | Freq: Two times a day (BID) | ORAL | Status: DC
  Administered 2021-08-21 – 2021-08-22 (×2): 200 mg via ORAL
  Filled 2021-08-21 (×2): qty 1

## 2021-08-21 MED ORDER — DIPHENHYDRAMINE HCL 12.5 MG/5ML PO ELIX: 12.5000 mg | ORAL_SOLUTION | ORAL | Status: DC | PRN

## 2021-08-21 MED ORDER — CEFAZOLIN SODIUM-DEXTROSE 2-4 GM/100ML-% IV SOLN
INTRAVENOUS | Status: AC
  Administered 2021-08-22: 2 g via INTRAVENOUS
  Filled 2021-08-21: qty 100

## 2021-08-21 MED ORDER — GABAPENTIN 300 MG PO CAPS
ORAL_CAPSULE | ORAL | Status: AC
  Administered 2021-08-21: 300 mg via ORAL
  Filled 2021-08-21: qty 1

## 2021-08-21 MED ORDER — MENTHOL 3 MG MT LOZG
1.0000 | LOZENGE | OROMUCOSAL | Status: DC | PRN
  Filled 2021-08-21: qty 9

## 2021-08-21 MED ORDER — ACETAMINOPHEN 10 MG/ML IV SOLN
INTRAVENOUS | Status: DC | PRN
  Administered 2021-08-21: 1000 mg via INTRAVENOUS

## 2021-08-21 MED ORDER — DIPHENHYDRAMINE HCL 25 MG PO TABS
50.0000 mg | ORAL_TABLET | Freq: Every day | ORAL | Status: DC
  Filled 2021-08-21: qty 2

## 2021-08-21 MED ORDER — HYDROMORPHONE HCL 1 MG/ML IJ SOLN: 0.5000 mg | INTRAMUSCULAR | Status: DC | PRN

## 2021-08-21 MED ORDER — CLOPIDOGREL BISULFATE 75 MG PO TABS
75.0000 mg | ORAL_TABLET | Freq: Every day | ORAL | Status: DC
  Administered 2021-08-22: 75 mg via ORAL
  Filled 2021-08-21: qty 1

## 2021-08-21 MED ORDER — MIDAZOLAM HCL 2 MG/2ML IJ SOLN
INTRAMUSCULAR | Status: AC
  Filled 2021-08-21: qty 2

## 2021-08-21 MED ORDER — FERROUS SULFATE 325 (65 FE) MG PO TABS
325.0000 mg | ORAL_TABLET | Freq: Two times a day (BID) | ORAL | Status: DC
  Administered 2021-08-21 – 2021-08-22 (×2): 325 mg via ORAL
  Filled 2021-08-21 (×2): qty 1

## 2021-08-21 MED ORDER — CEFAZOLIN SODIUM-DEXTROSE 2-4 GM/100ML-% IV SOLN
2.0000 g | Freq: Four times a day (QID) | INTRAVENOUS | Status: AC
Start: 2021-08-21 — End: 2021-08-22
  Administered 2021-08-21: 2 g via INTRAVENOUS
  Filled 2021-08-21 (×2): qty 100

## 2021-08-21 MED ORDER — PROPOFOL 1000 MG/100ML IV EMUL
INTRAVENOUS | Status: AC
  Filled 2021-08-21: qty 100

## 2021-08-21 MED ORDER — ACETAMINOPHEN 325 MG PO TABS: 325.0000 mg | ORAL_TABLET | Freq: Four times a day (QID) | ORAL | Status: DC | PRN

## 2021-08-21 MED ORDER — ENSURE PRE-SURGERY PO LIQD
296.0000 mL | Freq: Once | ORAL | Status: AC
  Administered 2021-08-21: 296 mL via ORAL
  Filled 2021-08-21: qty 296

## 2021-08-21 MED ORDER — LACTATED RINGERS IV SOLN: INTRAVENOUS | Status: DC

## 2021-08-21 MED ORDER — FAMOTIDINE 20 MG PO TABS
ORAL_TABLET | ORAL | Status: AC
  Administered 2021-08-21: 20 mg via ORAL
  Filled 2021-08-21: qty 1

## 2021-08-21 MED ORDER — TRAMADOL HCL 50 MG PO TABS
50.0000 mg | ORAL_TABLET | ORAL | Status: DC | PRN
  Administered 2021-08-21: 50 mg via ORAL
  Filled 2021-08-21: qty 1

## 2021-08-21 MED ORDER — MIDAZOLAM HCL 5 MG/5ML IJ SOLN
INTRAMUSCULAR | Status: DC | PRN
  Administered 2021-08-21: 1 mg via INTRAVENOUS

## 2021-08-21 MED ORDER — TRANEXAMIC ACID-NACL 1000-0.7 MG/100ML-% IV SOLN: 1000.0000 mg | Freq: Once | INTRAVENOUS | Status: AC

## 2021-08-21 MED ORDER — SODIUM CHLORIDE (PF) 0.9 % IJ SOLN
INTRAMUSCULAR | Status: DC | PRN
  Administered 2021-08-21: 120 mL via INTRAMUSCULAR

## 2021-08-21 MED ORDER — ACETAMINOPHEN 10 MG/ML IV SOLN
INTRAVENOUS | Status: AC
  Filled 2021-08-21: qty 100

## 2021-08-21 MED ORDER — HYPROMELLOSE (GONIOSCOPIC) 2.5 % OP SOLN: 1.0000 [drp] | Freq: Three times a day (TID) | OPHTHALMIC | Status: DC | PRN

## 2021-08-21 MED ORDER — INFLUENZA VAC A&B SA ADJ QUAD 0.5 ML IM PRSY
0.5000 mL | PREFILLED_SYRINGE | INTRAMUSCULAR | Status: DC
  Filled 2021-08-21 (×2): qty 0.5

## 2021-08-21 MED ORDER — ALUM & MAG HYDROXIDE-SIMETH 200-200-20 MG/5ML PO SUSP: 30.0000 mL | ORAL | Status: DC | PRN

## 2021-08-21 MED ORDER — TRANEXAMIC ACID-NACL 1000-0.7 MG/100ML-% IV SOLN
INTRAVENOUS | Status: AC
  Filled 2021-08-21: qty 100

## 2021-08-21 MED ORDER — MAGNESIUM HYDROXIDE 400 MG/5ML PO SUSP
30.0000 mL | Freq: Every day | ORAL | Status: DC
  Administered 2021-08-21: 30 mL via ORAL
  Filled 2021-08-21 (×2): qty 30

## 2021-08-21 MED ORDER — FENTANYL CITRATE (PF) 100 MCG/2ML IJ SOLN
INTRAMUSCULAR | Status: AC
  Filled 2021-08-21: qty 2

## 2021-08-21 MED ORDER — CHLORHEXIDINE GLUCONATE 4 % EX LIQD: 60.0000 mL | Freq: Once | CUTANEOUS | Status: DC

## 2021-08-21 MED ORDER — CEFAZOLIN SODIUM-DEXTROSE 2-4 GM/100ML-% IV SOLN
2.0000 g | INTRAVENOUS | Status: AC
  Administered 2021-08-21: 2 g via INTRAVENOUS

## 2021-08-21 MED ORDER — 0.9 % SODIUM CHLORIDE (POUR BTL) OPTIME
TOPICAL | Status: DC | PRN
  Administered 2021-08-21: 500 mL

## 2021-08-21 MED ORDER — PANTOPRAZOLE SODIUM 40 MG PO TBEC
40.0000 mg | DELAYED_RELEASE_TABLET | Freq: Two times a day (BID) | ORAL | Status: DC
  Administered 2021-08-21 – 2021-08-22 (×2): 40 mg via ORAL
  Filled 2021-08-21 (×2): qty 1

## 2021-08-21 MED ORDER — CALCIUM CARBONATE ANTACID 500 MG PO CHEW: 1.0000 | CHEWABLE_TABLET | Freq: Every day | ORAL | Status: DC | PRN

## 2021-08-21 MED ORDER — CELECOXIB 200 MG PO CAPS
ORAL_CAPSULE | ORAL | Status: AC
  Administered 2021-08-21: 400 mg via ORAL
  Filled 2021-08-21: qty 2

## 2021-08-21 MED ORDER — METOCLOPRAMIDE HCL 10 MG PO TABS
10.0000 mg | ORAL_TABLET | Freq: Three times a day (TID) | ORAL | Status: DC
  Administered 2021-08-21 – 2021-08-22 (×3): 10 mg via ORAL
  Filled 2021-08-21 (×3): qty 1

## 2021-08-21 MED ORDER — SODIUM CHLORIDE 0.9 % IR SOLN
Status: DC | PRN
  Administered 2021-08-21: 3000 mL

## 2021-08-21 MED ORDER — DEXAMETHASONE SODIUM PHOSPHATE 10 MG/ML IJ SOLN
INTRAMUSCULAR | Status: AC
  Administered 2021-08-21: 8 mg via INTRAVENOUS
  Filled 2021-08-21: qty 1

## 2021-08-21 MED ORDER — ASPIRIN 81 MG PO CHEW
81.0000 mg | CHEWABLE_TABLET | Freq: Two times a day (BID) | ORAL | Status: DC
  Administered 2021-08-22: 81 mg via ORAL
  Filled 2021-08-21: qty 1

## 2021-08-21 MED ORDER — CHLORHEXIDINE GLUCONATE 0.12 % MT SOLN
OROMUCOSAL | Status: AC
  Administered 2021-08-21: 15 mL via OROMUCOSAL
  Filled 2021-08-21: qty 15

## 2021-08-21 MED ORDER — ADULT MULTIVITAMIN W/MINERALS CH
1.0000 | ORAL_TABLET | Freq: Every day | ORAL | Status: DC
  Administered 2021-08-22: 1 via ORAL
  Filled 2021-08-21: qty 1

## 2021-08-21 MED ORDER — ONDANSETRON HCL 4 MG/2ML IJ SOLN
INTRAMUSCULAR | Status: DC | PRN
  Administered 2021-08-21: 4 mg via INTRAVENOUS

## 2021-08-21 MED ORDER — SODIUM CHLORIDE 0.9 % IV SOLN: INTRAVENOUS | Status: DC

## 2021-08-21 MED ORDER — GABAPENTIN 300 MG PO CAPS: 300.0000 mg | ORAL_CAPSULE | Freq: Once | ORAL | Status: AC

## 2021-08-21 MED ORDER — OXYCODONE HCL 5 MG/5ML PO SOLN: 5.0000 mg | Freq: Once | ORAL | Status: DC | PRN

## 2021-08-21 MED ORDER — SENNOSIDES-DOCUSATE SODIUM 8.6-50 MG PO TABS
1.0000 | ORAL_TABLET | Freq: Two times a day (BID) | ORAL | Status: DC
  Filled 2021-08-21 (×2): qty 1

## 2021-08-21 MED ORDER — DIPHENHYDRAMINE HCL 25 MG PO CAPS
25.0000 mg | ORAL_CAPSULE | Freq: Every day | ORAL | Status: DC
  Administered 2021-08-21: 25 mg via ORAL
  Filled 2021-08-21: qty 1

## 2021-08-21 MED ORDER — SURGIPHOR WOUND IRRIGATION SYSTEM - OPTIME
TOPICAL | Status: DC | PRN
  Administered 2021-08-21: 1

## 2021-08-21 MED ORDER — ONDANSETRON HCL 4 MG/2ML IJ SOLN: 4.0000 mg | Freq: Four times a day (QID) | INTRAMUSCULAR | Status: DC | PRN

## 2021-08-21 MED ORDER — CELECOXIB 200 MG PO CAPS: 400.0000 mg | ORAL_CAPSULE | Freq: Once | ORAL | Status: AC

## 2021-08-21 MED ORDER — EPHEDRINE SULFATE 50 MG/ML IJ SOLN
INTRAMUSCULAR | Status: DC | PRN
  Administered 2021-08-21 (×2): 5 mg via INTRAVENOUS

## 2021-08-21 MED ORDER — FAMOTIDINE 20 MG PO TABS: 20.0000 mg | ORAL_TABLET | Freq: Once | ORAL | Status: AC

## 2021-08-21 MED ORDER — CELECOXIB 200 MG PO CAPS
ORAL_CAPSULE | ORAL | Status: AC
  Filled 2021-08-21: qty 2

## 2021-08-21 MED ORDER — ACETAMINOPHEN 10 MG/ML IV SOLN
1000.0000 mg | Freq: Four times a day (QID) | INTRAVENOUS | Status: DC
  Administered 2021-08-21 – 2021-08-22 (×3): 1000 mg via INTRAVENOUS
  Filled 2021-08-21 (×3): qty 100

## 2021-08-21 MED ORDER — BUPIVACAINE HCL (PF) 0.5 % IJ SOLN
INTRAMUSCULAR | Status: DC | PRN
  Administered 2021-08-21: 3 mL

## 2021-08-21 MED ORDER — DEXAMETHASONE SODIUM PHOSPHATE 10 MG/ML IJ SOLN: 8.0000 mg | Freq: Once | INTRAMUSCULAR | Status: AC

## 2021-08-21 MED ORDER — FENTANYL CITRATE (PF) 100 MCG/2ML IJ SOLN
INTRAMUSCULAR | Status: DC | PRN
  Administered 2021-08-21 (×2): 25 ug via INTRAVENOUS

## 2021-08-21 MED ORDER — ORAL CARE MOUTH RINSE: 15.0000 mL | Freq: Once | OROMUCOSAL | Status: DC

## 2021-08-21 MED ORDER — POLYVINYL ALCOHOL 1.4 % OP SOLN
1.0000 [drp] | Freq: Three times a day (TID) | OPHTHALMIC | Status: DC | PRN
  Filled 2021-08-21: qty 15

## 2021-08-21 MED ORDER — ONDANSETRON HCL 4 MG PO TABS: 4.0000 mg | ORAL_TABLET | Freq: Four times a day (QID) | ORAL | Status: DC | PRN

## 2021-08-21 MED ORDER — FLEET ENEMA 7-19 GM/118ML RE ENEM: 1.0000 | ENEMA | Freq: Once | RECTAL | Status: DC | PRN

## 2021-08-21 MED ORDER — OXYCODONE HCL 5 MG PO TABS: 5.0000 mg | ORAL_TABLET | ORAL | Status: DC | PRN

## 2021-08-21 MED ORDER — TRANEXAMIC ACID-NACL 1000-0.7 MG/100ML-% IV SOLN
INTRAVENOUS | Status: AC
  Administered 2021-08-21: 1000 mg via INTRAVENOUS
  Filled 2021-08-21: qty 100

## 2021-08-21 MED ORDER — EPHEDRINE 5 MG/ML INJ
INTRAVENOUS | Status: AC
  Filled 2021-08-21: qty 5

## 2021-08-21 MED ORDER — OXYCODONE HCL 5 MG PO TABS
10.0000 mg | ORAL_TABLET | ORAL | Status: DC | PRN
  Filled 2021-08-21: qty 2

## 2021-08-21 MED ORDER — BISACODYL 10 MG RE SUPP: 10.0000 mg | Freq: Every day | RECTAL | Status: DC | PRN

## 2021-08-21 MED ORDER — PHENOL 1.4 % MT LIQD
1.0000 | OROMUCOSAL | Status: DC | PRN
  Filled 2021-08-21: qty 177

## 2021-08-21 MED ORDER — SODIUM CHLORIDE 0.9 % IV SOLN
INTRAVENOUS | Status: DC | PRN
  Administered 2021-08-21: 20 ug/min via INTRAVENOUS

## 2021-08-21 MED ORDER — FENTANYL CITRATE (PF) 100 MCG/2ML IJ SOLN: 25.0000 ug | INTRAMUSCULAR | Status: DC | PRN

## 2021-08-21 MED ORDER — PHENYLEPHRINE HCL (PRESSORS) 10 MG/ML IV SOLN
INTRAVENOUS | Status: AC
  Filled 2021-08-21: qty 1

## 2021-08-21 MED ORDER — ONDANSETRON HCL 4 MG/2ML IJ SOLN
INTRAMUSCULAR | Status: AC
  Filled 2021-08-21: qty 2

## 2021-08-21 MED ORDER — CHLORHEXIDINE GLUCONATE 0.12 % MT SOLN: 15.0000 mL | Freq: Once | OROMUCOSAL | Status: DC

## 2021-08-21 SURGICAL SUPPLY — 73 items
ATTUNE MED DOME PAT 38 KNEE (Knees) ×1 IMPLANT
ATTUNE PS FEM LT SZ 7 CEM KNEE (Femur) ×1 IMPLANT
ATTUNE PSRP INSR SZ7 5 KNEE (Insert) ×1 IMPLANT
BASE TIBIAL ROT PLAT SZ 8 KNEE (Knees) IMPLANT
BATTERY INSTRU NAVIGATION (MISCELLANEOUS) ×8 IMPLANT
BLADE SAW 70X12.5 (BLADE) ×2 IMPLANT
BLADE SAW 90X13X1.19 OSCILLAT (BLADE) ×2 IMPLANT
BLADE SAW 90X25X1.19 OSCILLAT (BLADE) ×2 IMPLANT
CEMENT HV SMART SET (Cement) ×2 IMPLANT
COOLER POLAR GLACIER W/PUMP (MISCELLANEOUS) ×2 IMPLANT
CUFF TOURN SGL QUICK 24 (TOURNIQUET CUFF)
CUFF TOURN SGL QUICK 34 (TOURNIQUET CUFF)
CUFF TRNQT CYL 24X4X16.5-23 (TOURNIQUET CUFF) IMPLANT
CUFF TRNQT CYL 34X4.125X (TOURNIQUET CUFF) IMPLANT
DRAPE 3/4 80X56 (DRAPES) ×2 IMPLANT
DRAPE INCISE IOBAN 66X45 STRL (DRAPES) ×2 IMPLANT
DRSG DERMACEA 8X12 NADH (GAUZE/BANDAGES/DRESSINGS) ×2 IMPLANT
DRSG MEPILEX SACRM 8.7X9.8 (GAUZE/BANDAGES/DRESSINGS) ×2 IMPLANT
DRSG OPSITE POSTOP 4X14 (GAUZE/BANDAGES/DRESSINGS) ×2 IMPLANT
DRSG TEGADERM 4X4.75 (GAUZE/BANDAGES/DRESSINGS) ×2 IMPLANT
DURAPREP 26ML APPLICATOR (WOUND CARE) ×4 IMPLANT
ELECT CAUTERY BLADE 6.4 (BLADE) ×2 IMPLANT
ELECT REM PT RETURN 9FT ADLT (ELECTROSURGICAL) ×2
ELECTRODE REM PT RTRN 9FT ADLT (ELECTROSURGICAL) ×1 IMPLANT
EX-PIN ORTHOLOCK NAV 4X150 (PIN) ×4 IMPLANT
GAUZE 4X4 16PLY ~~LOC~~+RFID DBL (SPONGE) IMPLANT
GLOVE SURG ENC TEXT LTX SZ7.5 (GLOVE) ×4 IMPLANT
GLOVE SURG UNDER POLY LF SZ7.5 (GLOVE) ×4 IMPLANT
GOWN STRL REUS W/ TWL LRG LVL3 (GOWN DISPOSABLE) ×2 IMPLANT
GOWN STRL REUS W/ TWL XL LVL3 (GOWN DISPOSABLE) ×1 IMPLANT
GOWN STRL REUS W/TWL LRG LVL3 (GOWN DISPOSABLE) ×4
GOWN STRL REUS W/TWL XL LVL3 (GOWN DISPOSABLE) ×2
HEMOVAC 400CC 10FR (MISCELLANEOUS) ×2 IMPLANT
HOLDER FOLEY CATH W/STRAP (MISCELLANEOUS) ×2 IMPLANT
IRRIGATION SURGIPHOR STRL (IV SOLUTION) ×2 IMPLANT
IV NS IRRIG 3000ML ARTHROMATIC (IV SOLUTION) ×2 IMPLANT
KIT TURNOVER KIT A (KITS) ×2 IMPLANT
KNIFE SCULPS 14X20 (INSTRUMENTS) ×2 IMPLANT
LABEL OR SOLS (LABEL) ×1 IMPLANT
MANIFOLD NEPTUNE II (INSTRUMENTS) ×4 IMPLANT
NDL SAFETY ECLIPSE 18X1.5 (NEEDLE) ×1 IMPLANT
NDL SPNL 20GX3.5 QUINCKE YW (NEEDLE) ×2 IMPLANT
NEEDLE HYPO 18GX1.5 SHARP (NEEDLE) ×2
NEEDLE SPNL 20GX3.5 QUINCKE YW (NEEDLE) ×4 IMPLANT
NS IRRIG 500ML POUR BTL (IV SOLUTION) ×2 IMPLANT
PACK TOTAL KNEE (MISCELLANEOUS) ×2 IMPLANT
PAD ABD DERMACEA PRESS 5X9 (GAUZE/BANDAGES/DRESSINGS) ×4 IMPLANT
PAD WRAPON POLAR KNEE (MISCELLANEOUS) ×1 IMPLANT
PENCIL SMOKE EVACUATOR COATED (MISCELLANEOUS) ×2 IMPLANT
PIN DRILL FIX HALF THREAD (BIT) ×4 IMPLANT
PIN FIXATION 1/8DIA X 3INL (PIN) ×2 IMPLANT
PULSAVAC PLUS IRRIG FAN TIP (DISPOSABLE) ×2
SOL PREP PVP 2OZ (MISCELLANEOUS) ×2
SOLUTION PREP PVP 2OZ (MISCELLANEOUS) ×1 IMPLANT
SPONGE DRAIN TRACH 4X4 STRL 2S (GAUZE/BANDAGES/DRESSINGS) ×2 IMPLANT
SPONGE T-LAP 18X18 ~~LOC~~+RFID (SPONGE) ×6 IMPLANT
STAPLER SKIN PROX 35W (STAPLE) ×2 IMPLANT
STOCKINETTE IMPERV 14X48 (MISCELLANEOUS) IMPLANT
STRAP TIBIA SHORT (MISCELLANEOUS) ×2 IMPLANT
SUCTION FRAZIER HANDLE 10FR (MISCELLANEOUS) ×2
SUCTION TUBE FRAZIER 10FR DISP (MISCELLANEOUS) ×1 IMPLANT
SUT VIC AB 0 CT1 36 (SUTURE) ×4 IMPLANT
SUT VIC AB 1 CT1 36 (SUTURE) ×4 IMPLANT
SUT VIC AB 2-0 CT2 27 (SUTURE) ×2 IMPLANT
SYR 20ML LL LF (SYRINGE) ×2 IMPLANT
SYR 30ML LL (SYRINGE) ×4 IMPLANT
TIBIAL BASE ROT PLAT SZ 8 KNEE (Knees) ×2 IMPLANT
TIP FAN IRRIG PULSAVAC PLUS (DISPOSABLE) ×1 IMPLANT
TOWEL OR 17X26 4PK STRL BLUE (TOWEL DISPOSABLE) ×2 IMPLANT
TOWER CARTRIDGE SMART MIX (DISPOSABLE) ×2 IMPLANT
TRAY FOLEY MTR SLVR 16FR STAT (SET/KITS/TRAYS/PACK) ×2 IMPLANT
WATER STERILE IRR 500ML POUR (IV SOLUTION) ×2 IMPLANT
WRAPON POLAR PAD KNEE (MISCELLANEOUS) ×2

## 2021-08-21 NOTE — Anesthesia Procedure Notes (Signed)
Spinal  Patient location during procedure: OR Start time: 08/21/2021 11:25 AM End time: 08/21/2021 11:30 AM Reason for block: surgical anesthesia Staffing Performed: resident/CRNA  Anesthesiologist: Piscitello, Precious Haws, MD Resident/CRNA: Natasha Mead, CRNA Preanesthetic Checklist Completed: patient identified, IV checked, site marked, risks and benefits discussed, surgical consent, monitors and equipment checked, pre-op evaluation and timeout performed Spinal Block Patient position: sitting Prep: DuraPrep Patient monitoring: heart rate, cardiac monitor, continuous pulse ox and blood pressure Approach: midline Location: L4-5 Injection technique: single-shot Needle Needle type: Pencan  Needle gauge: 24 G Needle length: 9 cm Assessment Sensory level: T4 Events: CSF return

## 2021-08-21 NOTE — H&P (Signed)
The patient has been re-examined, and the chart reviewed, and there have been no interval changes to the documented history and physical.    The risks, benefits, and alternatives have been discussed at length. The patient expressed understanding of the risks benefits and agreed with plans for surgical intervention.  Iylah Dworkin P. Malayzia Laforte, Jr. M.D.    

## 2021-08-21 NOTE — Anesthesia Preprocedure Evaluation (Signed)
Anesthesia Evaluation  Patient identified by MRN, date of birth, ID band Patient awake    Reviewed: Allergy & Precautions, NPO status , Patient's Chart, lab work & pertinent test results  History of Anesthesia Complications Negative for: history of anesthetic complications  Airway Mallampati: III  TM Distance: >3 FB Neck ROM: full    Dental  (+) Chipped, Poor Dentition, Missing   Pulmonary asthma , COPD, Current Smoker and Patient abstained from smoking.,    Pulmonary exam normal        Cardiovascular Exercise Tolerance: Good (-) angina(-) Past MI and (-) DOE negative cardio ROS Normal cardiovascular exam     Neuro/Psych CVA negative psych ROS   GI/Hepatic Neg liver ROS, GERD  Medicated and Controlled,  Endo/Other  negative endocrine ROS  Renal/GU      Musculoskeletal  (+) Arthritis ,   Abdominal   Peds  Hematology negative hematology ROS (+)   Anesthesia Other Findings Past Medical History: No date: Asthma     Comment:  enviromental allergies No date: Cancer ([facility]) No date: GERD (gastroesophageal reflux disease) No date: HOH (hard of hearing)     Comment:  AIDS No date: Stroke [facility])     Comment:  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, has since resoved No date: Vertigo  Past Surgical History: 12/22/2020: CAROTID PTA/STENT INTERVENTION; Left     Comment:  Procedure: CAROTID PTA/STENT INTERVENTION;  Surgeon:               Algernon Huxley, MD;  Location: [facility];                Service: Cardiovascular;  Laterality: Left; 04/06/2018: CATARACT EXTRACTION W/PHACO; Left     Comment:  Procedure: CATARACT EXTRACTION PHACO AND INTRAOCULAR               LENS PLACEMENT (IOC);  Surgeon: Eulogio Bear, MD;                Location: [facility];  Service: Ophthalmology;  Laterality:              Left;  [place] 00:19.8 AP% 5.8 CDE 1.15 Fluid pack lot #                [id number] 05/04/2018: CATARACT EXTRACTION W/PHACO; Right     Comment:  Procedure: CATARACT EXTRACTION PHACO AND INTRAOCULAR               LENS PLACEMENT (IOC);  Surgeon: Eulogio Bear, MD;                Location: [facility];  Service: Ophthalmology;  Laterality:              Right;  fluid pack lot # [id number] H  exp 12/29/2018 [place]               00:27.7 AP% 7.1 CDE 1.98 08/22/2019: COLONOSCOPY WITH PROPOFOL; N/A     Comment:  Procedure: COLONOSCOPY WITH PROPOFOL;  Surgeon: Toledo,               Benay Pike, MD;  Location: [facility];  Service:               Gastroenterology;  Laterality: N/A; No date: EYE SURGERY No date: JOINT REPLACEMENT     Comment:  TKR 08/18/2016: KNEE ARTHROPLASTY; Right  Comment:  Procedure: COMPUTER ASSISTED TOTAL KNEE ARTHROPLASTY;                Surgeon: Dereck Leep, MD;  Location: [facility];                Service: Orthopedics;  Laterality: Right; No date: LACERATION REPAIR; Left     Comment:  thumb  BMI    Body Mass Index: 31.99 kg/m      Reproductive/Obstetrics negative OB ROS                             Anesthesia Physical Anesthesia Plan  ASA: 3  Anesthesia Plan: Spinal   Post-op Pain Management:    Induction:   PONV Risk Score and Plan:   Airway Management Planned: Natural Airway and Nasal Cannula  Additional Equipment:   Intra-op Plan:   Post-operative Plan:   Informed Consent: I have reviewed the patients History and Physical, chart, labs and discussed the procedure including the risks, benefits and alternatives for the proposed anesthesia with the patient or authorized representative who has indicated his/her understanding and acceptance.     Dental Advisory Given  Plan Discussed with: Anesthesiologist, CRNA and Surgeon  Anesthesia Plan Comments: (Patient reports no bleeding problems and no anticoagulant use (No Plavix in greater than 7 days).  Plan for spinal with backup GA  Patient  consented for risks of anesthesia including but not limited to:  - adverse reactions to medications - damage to eyes, teeth, lips or other oral mucosa - nerve damage due to positioning  - risk of bleeding, infection and or nerve damage from spinal that could lead to paralysis - risk of headache or failed spinal - damage to teeth, lips or other oral mucosa - sore throat or hoarseness - damage to heart, brain, nerves, lungs, other parts of body or loss of life  Patient voiced understanding.)        Anesthesia Quick Evaluation

## 2021-08-21 NOTE — Op Note (Signed)
OPERATIVE NOTE  DATE OF SURGERY:  08/21/2021  PATIENT NAME:  Seth Holt   DOB: February 28, 1946  MRN: [id number]  PRE-OPERATIVE DIAGNOSIS: Degenerative arthrosis of the left knee, primary  POST-OPERATIVE DIAGNOSIS:  Same  PROCEDURE:  Left total knee arthroplasty using computer-assisted navigation  SURGEON:  Marciano Sequin. M.D.  ANESTHESIA: spinal  ESTIMATED BLOOD LOSS: 50 mL  FLUIDS REPLACED: 1000 mL of crystalloid  TOURNIQUET TIME: 85 minutes  DRAINS: 2 medium Hemovac drains  SOFT TISSUE RELEASES: Anterior cruciate ligament, posterior cruciate ligament, deep medial collateral ligament, patellofemoral ligament  IMPLANTS UTILIZED: DePuy Attune size 7 posterior stabilized femoral component (cemented), size 8 rotating platform tibial component (cemented), 38 mm medialized dome patella (cemented), and a 5 mm stabilized rotating platform polyethylene insert.  INDICATIONS FOR SURGERY: Seth Holt is a 75 y.o. year old male with a long history of progressive knee pain. X-rays demonstrated severe degenerative changes in tricompartmental fashion. The patient had not seen any significant improvement despite conservative nonsurgical intervention. After discussion of the risks and benefits of surgical intervention, the patient expressed understanding of the risks benefits and agree with plans for total knee arthroplasty.   The risks, benefits, and alternatives were discussed at length including but not limited to the risks of infection, bleeding, nerve injury, stiffness, blood clots, the need for revision surgery, cardiopulmonary complications, among others, and they were willing to proceed.  PROCEDURE IN DETAIL: The patient was brought into the operating room and, after adequate spinal anesthesia was achieved, a tourniquet was placed on the patient's upper thigh. The patient's knee and leg were cleaned and prepped with alcohol and DuraPrep and draped in the usual sterile fashion. A  "timeout" was performed as per usual protocol. The lower extremity was exsanguinated using an Esmarch, and the tourniquet was inflated to 300 mmHg. An anterior longitudinal incision was made followed by a standard mid vastus approach. The deep fibers of the medial collateral ligament were elevated in a subperiosteal fashion off of the medial flare of the tibia so as to maintain a continuous soft tissue sleeve. The patella was subluxed laterally and the patellofemoral ligament was incised. Inspection of the knee demonstrated severe degenerative changes with full-thickness loss of articular cartilage. Osteophytes were debrided using a rongeur. Anterior and posterior cruciate ligaments were excised. Two 4.0 mm Schanz pins were inserted in the femur and into the tibia for attachment of the array of trackers used for computer-assisted navigation. Hip center was identified using a circumduction technique. Distal landmarks were mapped using the computer. The distal femur and proximal tibia were mapped using the computer. The distal femoral cutting guide was positioned using computer-assisted navigation so as to achieve a 5 distal valgus cut. The femur was sized and it was felt that a size 7 femoral component was appropriate. A size 7 femoral cutting guide was positioned and the anterior cut was performed and verified using the computer. This was followed by completion of the posterior and chamfer cuts. Femoral cutting guide for the central box was then positioned in the center box cut was performed.  Attention was then directed to the proximal tibia. Medial and lateral menisci were excised. The extramedullary tibial cutting guide was positioned using computer-assisted navigation so as to achieve a 0 varus-valgus alignment and 3 posterior slope. The cut was performed and verified using the computer. The proximal tibia was sized and it was felt that a size 8 tibial tray was appropriate. Tibial and femoral trials were  inserted followed  by insertion of a 5 mm polyethylene insert. This allowed for excellent mediolateral soft tissue balancing both in flexion and in full extension. Finally, the patella was cut and prepared so as to accommodate a 38 mm medialized dome patella. A patella trial was placed and the knee was placed through a range of motion with excellent patellar tracking appreciated. The femoral trial was removed after debridement of posterior osteophytes. The central post-hole for the tibial component was reamed followed by insertion of a keel punch. Tibial trials were then removed. Cut surfaces of bone were irrigated with copious amounts of normal saline using pulsatile lavage and then suctioned dry. Polymethylmethacrylate cement was prepared in the usual fashion using a vacuum mixer. Cement was applied to the cut surface of the proximal tibia as well as along the undersurface of a size 8 rotating platform tibial component. Tibial component was positioned and impacted into place. Excess cement was removed using [employer] fast streamer. Cement was then applied to the cut surfaces of the femur as well as along the posterior flanges of the size 7 femoral component. The femoral component was positioned and impacted into place. Excess cement was removed using [employer] fast streamer. A 5 mm polyethylene trial was inserted and the knee was brought into full extension with steady axial compression applied. Finally, cement was applied to the backside of a 38 mm medialized dome patella and the patellar component was positioned and patellar clamp applied. Excess cement was removed using [employer] fast streamer. After adequate curing of the cement, the tourniquet was deflated after a total tourniquet time of 85 minutes. Hemostasis was achieved using electrocautery. The knee was irrigated with copious amounts of normal saline using pulsatile lavage followed by 500 ml of Surgiphor and then suctioned dry. 20 mL of 1.3% Exparel and 60 mL of 0.25% Marcaine  in 40 mL of normal saline was injected along the posterior capsule, medial and lateral gutters, and along the arthrotomy site. A 5 mm stabilized rotating platform polyethylene insert was inserted and the knee was placed through a range of motion with excellent mediolateral soft tissue balancing appreciated and excellent patellar tracking noted. 2 medium drains were placed in the wound bed and brought out through separate stab incisions. The medial parapatellar portion of the incision was reapproximated using interrupted sutures of #1 Vicryl. Subcutaneous tissue was approximated in layers using first #0 Vicryl followed #2-0 Vicryl. The skin was approximated with skin staples. A sterile dressing was applied.  The patient tolerated the procedure well and was transported to the recovery room in stable condition.    Jaycey Gens P. Holley Bouche., M.D.

## 2021-08-21 NOTE — Transfer of Care (Signed)
Immediate Anesthesia Transfer of Care Note  Patient: Seth Holt  Procedure(s) Performed: COMPUTER ASSISTED TOTAL KNEE ARTHROPLASTY - RNFA (Left: Knee)  Patient Location: PACU  Anesthesia Type:Spinal  Level of Consciousness: awake, alert  and oriented  Airway & Oxygen Therapy: Patient Spontanous Breathing  Post-op Assessment: Report given to RN and Post -op Vital signs reviewed and stable  Post vital signs: stable  Last Vitals:  Vitals Value Taken Time  BP 100/68 08/21/21 1445  Temp    Pulse 76 08/21/21 1448  Resp 17 08/21/21 1448  SpO2 100 % 08/21/21 1448  Vitals shown include unvalidated device data.  Last Pain:  Vitals:   08/21/21 1020  TempSrc: Oral  PainSc: 0-No pain         Complications: No notable events documented.

## 2021-08-21 NOTE — H&P (Signed)
ORTHOPAEDIC HISTORY & PHYSICAL Seth Holt, Seth Holt, Seth Holt - 08/11/2021 3:45 PM EDT Formatting of this note is different from the original. [place] [facility] Chief Complaint:   Chief Complaint  Patient presents with   Left Knee - Follow-up  H&P Lt TKA 08/21/21   History of Present Illness:   Seth Holt is a 75 y.o. male that presents to clinic today for his preoperative history and evaluation. Patient presents unaccompanied. The patient is scheduled to undergo a left total knee arthroplasty on 08/21/21 by Dr. Marry Guan. His pain began several years ago. The pain is located along the medial aspect of the knee. He describes his pain as worse with weightbearing. He reports associated swelling with some giving way of the knee. He denies associated numbness or tingling, denies locking.   The patient's symptoms have progressed to the point that they decrease his quality of life. The patient has previously undergone conservative treatment including NSAIDS and injections to the knee without adequate control of his symptoms.  Denies any significant cardiac history, lumbar surgery, history of blood clots. Does have a history of carotid stenosis with amaurosis fugax for which he takes Plavix.  Past Medical, Surgical, Family, Social History, Allergies, Medications:   Past Medical History:  Past Medical History:  Diagnosis Date   Asthma   Bilateral carotid artery stenosis   Colon polyps   Hyperlipidemia  Amaurosis fugax-Dr Lucky Cowboy   Past Surgical History:  Past Surgical History:  Procedure Laterality Date   COLONOSCOPY 07/29/2014  Adenomatous Polyps, PH Non Advanced Adenoma & FH CCA 1 degree relative - repeat 5 years per Dr. Rayann Heman   COLONOSCOPY 08/22/2019  Nomral Colon/PHx CP/Repeat 52yrs/TKT   Left Carotid PTA/Stent 12/22/2020  Dr Leotis Pain   Left cataract extraction phaco & intraocular lens placement 04/06/2018  Dr Volney American   Right cataract extraction  phaco & intraocular lens placement 05/04/2018  Dr Volney American   Right total knee arthroplasty using computer-assisted navigation 08/18/2016  Dr Marry Guan   WISDOM TEETH   Current Medications:  Current Outpatient Medications  Medication Sig Dispense Refill   acetaminophen 500 mg Chew Take 1,000 mg by mouth 4 (four) times daily as needed   artificial tears (GONIOSCOP-HPM) 2.5 % ophthalmic solution Apply 1 drop to eye 3 (three) times daily as needed   aspirin 81 MG EC tablet Take 81 mg by mouth once daily   atorvastatin (LIPITOR) 10 MG tablet Take 10 mg by mouth once daily   calcium carbonate (TUMS E-X) 300 mg (750 mg) chewable tablet Take by mouth.   chlorpheniramine (CHLOR-TRIMETON) 4 mg tablet Take 4 mg by mouth 2 (two) times daily as needed   clopidogreL (PLAVIX) 75 mg tablet Take 75 mg by mouth once daily   diphenhydrAMINE (BENADRYL) 25 mg tablet Take 25 mg by mouth nightly   docusate (COLACE) 50 MG capsule Take by mouth 2 (two) times daily   glucosamine su 2KCl-chondroit 500-400 mg Tab Take 2 tablets by mouth once daily   multivitamin-lutein (CENTRUM SILVER) tablet Take 1 tablet by mouth once daily.   TURMERIC ROOT-GINGER ROOT EXT ORAL Take 1 tablet by mouth once daily Turmeric Curcumin with Ginger Powder-500mg    No current facility-administered medications for this visit.   Allergies: No Known Allergies  Social History:  Social History   Socioeconomic History   Marital status: Married  Spouse name: Seth Holt   Number of children: 1   Years of education: 82  Occupational History   Occupation:  Retired  Tobacco Use   Smoking status: Former Smoker  Packs/day: 1.00  Years: 25.00  Pack years: 25.00  Types: Cigarettes, Cigars  Quit date: 1992  Years since quitting: 30.7   Smokeless tobacco: Never Used   Tobacco comment: Smokes tobacco pipe once in a while  Vaping Use   Vaping Use: Never used  Substance and Sexual Activity   Alcohol use: No  Alcohol/week: 0.0 standard drinks    Drug use: No   Sexual activity: Yes  Partners: Female   Family History:  Family History  Problem Relation Age of Onset   Colon cancer Father   Heart disease Mother   Heart disease Sister   Inflammatory bowel disease Neg Hx   Review of Systems:   A 10+ ROS was performed, reviewed, and the pertinent orthopaedic findings are documented in the HPI.   Physical Examination:   BP 118/76  Ht 180.3 cm (5\' 11" )  Wt (!) 105.6 kg (232 lb 12.8 oz)  BMI 32.47 kg/m   Patient is a well-developed, well-nourished male in no acute distress. Patient has normal mood and affect. Patient is alert and oriented to person, place, and time.   HEENT: Atraumatic, normocephalic. Pupils equal and reactive to light. Extraocular motion intact. Noninjected sclera.  Cardiovascular: Regular rate and rhythm, with no murmurs, rubs, or gallops. Distal pulses palpable.   Respiratory: Lungs clear to auscultation bilaterally.   Left Knee: Soft tissue swelling: mild Effusion: none Erythema: none Crepitance: mild Tenderness: medial Alignment: relative varus Mediolateral laxity: medial pseudolaxity Posterior sag: negative Patellar tracking: Good tracking without evidence of subluxation or tilt Atrophy: No significant atrophy.  Quadriceps tone was good. Range of motion: 0/4/134 degrees   Sensation intact over the saphenous, lateral sural cutaneous, superficial fibular, and deep fibular nerve distributions.  Tests Performed/Reviewed:  X-rays  3 views of the left knee were obtained. Images reveal severe loss of medial compartment joint space with osteophyte formation. No fractures or dislocations. No other osseous abnormality noted  I personally ordered and interpreted today's xrays.  Impression:   ICD-10-CM  1. Primary osteoarthritis of left knee M17.12   Plan:   The patient has end-stage degenerative changes of the left knee. It was explained to the patient that the condition is progressive in  nature. Having failed conservative treatment, the patient has elected to proceed with a total joint arthroplasty. The patient will undergo a total joint arthroplasty with Dr. Marry Guan. The risks of surgery, including blood clot and infection, were discussed with the patient. Measures to reduce these risks, including the use of anticoagulation, perioperative antibiotics, and early ambulation were discussed. The importance of postoperative physical therapy was discussed with the patient. The patient elects to proceed with surgery. The patient is instructed to stop all blood thinners prior to surgery. The patient is instructed to call the hospital the day before surgery to learn of the proper arrival time.   Contact our office with any questions or concerns. Follow up as indicated, or sooner should any new problems arise, if conditions worsen, or if they are otherwise concerned.   Gwenlyn Fudge, [facility] [place],  [id number] Phone: [phone]  This note was generated in part with voice recognition software and I apologize for any typographical errors that were not detected and corrected.  Electronically signed by Gwenlyn Fudge, PA at 08/16/2021 9:02 AM EDT

## 2021-08-21 NOTE — Evaluation (Signed)
Physical Therapy Evaluation Patient Details Name: Seth Holt MRN: [id number] DOB: May 01, 1946 Today's Date: 08/21/2021  History of Present Illness  Pt is a 75 yo M diagnosed with degenerative arthrosis of the left knee and is s/p elective L TKA. PMH includes COPD, CVA, HOH, L carotid stent, and R TKA in 2017.   Clinical Impression  Pt was pleasant and motivated to participate during the session and put forth very good effort throughout.  Pt demonstrated return of sensation, proprioception, and functional strength to BLEs including being able to easily perform LLE SLR's without extensor lag.  Pt was able to go from sup to/from sit with no physical assistance as well.  Upon coming to standing, however, pt was unsteady, had difficulty coming to full upright standing, and was only able to take a few small, shuffling steps at the EOB before returning to sitting. Pt is very active at baseline and is expected to make good progress while in acute care. Pt will benefit from HHPT upon discharge to safely address deficits listed in patient problem list for decreased caregiver assistance and eventual return to PLOF.         Recommendations for follow up therapy are one component of a multi-disciplinary discharge planning process, led by the attending physician.  Recommendations may be updated based on patient status, additional functional criteria and insurance authorization.  Follow Up Recommendations Home health PT;Supervision for mobility/OOB    Equipment Recommendations  Other (comment) (Pt owns a RW but not a BSC. Pt declined a BSC stating that current setup would be sufficient.)    Recommendations for Other Services       Precautions / Restrictions Precautions Precautions: Fall Restrictions Weight Bearing Restrictions: Yes LLE Weight Bearing: Weight bearing as tolerated Other Position/Activity Restrictions: Pt able to perform Ind LLE SLR without extensor lag, no KI required       Mobility  Bed Mobility Overal bed mobility: Modified Independent             General bed mobility comments: Min extra time and effort only    Transfers Overall transfer level: Needs assistance Equipment used: Rolling walker (2 wheeled) Transfers: Sit to/from Stand Sit to Stand: From elevated surface;Min assist         General transfer comment: Pt with difficulty coming to full upright standing  Ambulation/Gait Ambulation/Gait assistance: Min assist Gait Distance (Feet): 1 Feet Assistive device: Rolling walker (2 wheeled) Gait Pattern/deviations: Trunk flexed;Step-to pattern;Decreased step length - right;Decreased step length - left;Antalgic;Shuffle Gait velocity: decreased   General Gait Details: Pt able to take several very small shuffling steps towards the [facility] with min A for stability  Stairs            Wheelchair Mobility    Modified Rankin (Stroke Patients Only)       Balance Overall balance assessment: Needs assistance   Sitting balance-Leahy Scale: Normal     Standing balance support: Bilateral upper extremity supported;During functional activity Standing balance-Leahy Scale: Poor Standing balance comment: Min A for stability in standing                             Pertinent Vitals/Pain Pain Assessment: 0-10 Pain Score: 3  Pain Location: L knee Pain Descriptors / Indicators: Sore Pain Intervention(s): Premedicated before session;Monitored during session;Ice applied    Home Living Family/patient expects to be discharged to:: Private residence Living Arrangements: Spouse/significant other Available Help at Discharge: Family;Available 24 hours/day Type  of Home: House Home Access: Stairs to enter Entrance Stairs-Rails: Right;Left (Too wide for both) Entrance Stairs-Number of Steps: 2 Home Layout: Two level;Able to live on main level with bedroom/bathroom Home Equipment: Gilford Rile - 2 wheels      Prior Function Level of  Independence: Independent         Comments: Ind amb community distances without an AD, no fall history, active including hiking and mowing yard with a push mower     Hand Dominance        Extremity/Trunk Assessment   Upper Extremity Assessment Upper Extremity Assessment: Overall WFL for tasks assessed    Lower Extremity Assessment Lower Extremity Assessment: Generalized weakness;LLE deficits/detail LLE Deficits / Details: Able to perform Ind SLR without extensor lag with no difficulty; BLE sensation to light touch and proprioception WNL LLE: Unable to fully assess due to pain LLE Sensation: WNL       Communication   Communication: No difficulties  Cognition Arousal/Alertness: Awake/alert Behavior During Therapy: WFL for tasks assessed/performed Overall Cognitive Status: Within Functional Limits for tasks assessed                                        General Comments      Exercises Total Joint Exercises Ankle Circles/Pumps: AROM;Strengthening;Both;10 reps Quad Sets: AROM;Strengthening;Left;10 reps;5 reps Straight Leg Raises: AROM;Strengthening;Left;5 reps Long Arc Quad: AROM;Strengthening;Left;10 reps;5 reps Knee Flexion: AROM;Strengthening;Left;5 reps;10 reps Goniometric ROM: L knee AROM: 1-84 deg Other Exercises Other Exercises: HEP education per handout   Assessment/Plan    PT Assessment Patient needs continued PT services  PT Problem List Decreased strength;Decreased range of motion;Decreased activity tolerance;Decreased balance;Decreased mobility;Pain;Decreased knowledge of use of DME       PT Treatment Interventions DME instruction;Gait training;Stair training;Functional mobility training;Therapeutic activities;Therapeutic exercise;Balance training;Patient/family education    PT Goals (Current goals can be found in the Care Plan section)  Acute Rehab PT Goals Patient Stated Goal: To get back to being able to do yard work and hike PT  Goal Formulation: With patient Time For Goal Achievement: 09/03/21 Potential to Achieve Goals: Good    Frequency BID   Barriers to discharge        Co-evaluation               AM-PAC PT "6 Clicks" Mobility  Outcome Measure Help needed turning from your back to your side while in a flat bed without using bedrails?: A Little Help needed moving from lying on your back to sitting on the side of a flat bed without using bedrails?: A Little Help needed moving to and from a bed to a chair (including a wheelchair)?: A Lot Help needed standing up from a chair using your arms (e.g., wheelchair or bedside chair)?: A Little Help needed to walk in hospital room?: Total Help needed climbing 3-5 steps with a railing? : Total 6 Click Score: 13    End of Session Equipment Utilized During Treatment: Gait belt Activity Tolerance: Patient tolerated treatment well Patient left: in bed;with call bell/phone within reach;with bed alarm set;with SCD's reapplied;with family/visitor present;Other (comment) (Polar care to L knee) Nurse Communication: Mobility status;Weight bearing status PT Visit Diagnosis: Other abnormalities of gait and mobility (R26.89);Muscle weakness (generalized) (M62.81);Pain Pain - Right/Left: Left Pain - part of body: Knee    Time: 1640-1716 PT Time Calculation (min) (ACUTE ONLY): 36 min   Charges:   PT Evaluation $PT  Eval Moderate Complexity: 1 Mod PT Treatments $Therapeutic Exercise: 8-22 mins        D. Scott Kerrin Markman PT, DPT 08/21/21, 5:50 PM

## 2021-08-22 DIAGNOSIS — M1712 Unilateral primary osteoarthritis, left knee: Secondary | ICD-10-CM | POA: Diagnosis not present

## 2021-08-22 MED ORDER — TAMSULOSIN HCL 0.4 MG PO CAPS
0.8000 mg | ORAL_CAPSULE | Freq: Every day | ORAL | Status: DC
  Administered 2021-08-22: 0.8 mg via ORAL
  Filled 2021-08-22: qty 2

## 2021-08-22 MED ORDER — CELECOXIB 200 MG PO CAPS: 200.0000 mg | ORAL_CAPSULE | Freq: Two times a day (BID) | ORAL | 0 refills | Status: AC

## 2021-08-22 MED ORDER — TRAMADOL HCL 50 MG PO TABS: 50.0000 mg | ORAL_TABLET | Freq: Four times a day (QID) | ORAL | 0 refills | Status: AC | PRN

## 2021-08-22 MED ORDER — ONDANSETRON HCL 4 MG PO TABS: 4.0000 mg | ORAL_TABLET | Freq: Four times a day (QID) | ORAL | 0 refills | Status: AC | PRN

## 2021-08-22 MED ORDER — OXYCODONE HCL 5 MG PO TABS: 5.0000 mg | ORAL_TABLET | ORAL | 0 refills | Status: AC | PRN

## 2021-08-22 MED ORDER — ASPIRIN 81 MG PO TBEC: 81.0000 mg | DELAYED_RELEASE_TABLET | Freq: Two times a day (BID) | ORAL | 3 refills | Status: AC

## 2021-08-22 NOTE — Progress Notes (Signed)
Pt d/c this AM to home with wife via POV.  D/c paperwork was reviewed with pt and he expressed full understanding.  Bilateral thigh high TEDS were one at time of d/c.  Pt left with all personal belongings and also his bone foam, polar care, additional dressing and IS.  IV removed from pts R hand without issue.  Pt is aware of his follow up appointments with PA and MD.

## 2021-08-22 NOTE — TOC Transition Note (Signed)
Transition of Care [facility]) - CM/SW Discharge Note   Patient Details  Name: Seth Holt MRN: [id number] Date of Birth: 1945-12-23  [facility]) CM/SW Contact:  Harriet Masson, RN Phone Number: [phone] 08/22/2021, 10:19 AM   Clinical Narrative:    Spoke with pt concerning recommended [facility]. [facility] already discussed and arranged. RN contacted [employer] (rep for [facility]) on pt's discharge today. No other needs presented at this time. Pt very appreciative and will have sufficient transportation with his spouse for all medical appointments and medications.  No other needs at address at this time. TOC team will remain available to assist with any other needs.  Final next level of care: [place] Barriers to Discharge: Barriers Resolved   Patient Goals and CMS Choice     Choice offered to / list presented to : Patient  Discharge Placement                    Patient and family notified of of transfer: 08/22/21  Discharge Plan and Services                          HH Arranged: PT [facility] Agency: [place] Date Micro: 08/22/21 Time [place]: [id number] Representative spoke with at [place]: [employer]  Social Determinants of Health ([place]) Interventions     Readmission Risk Interventions No flowsheet data found.

## 2021-08-22 NOTE — Anesthesia Postprocedure Evaluation (Signed)
Anesthesia Post Note  Patient: Seth Holt  Procedure(s) Performed: COMPUTER ASSISTED TOTAL KNEE ARTHROPLASTY - RNFA (Left: Knee)  Anesthesia Type: Spinal Anesthetic complications: no Comments: Patient discharged. Per nursing notes patient did fine.    No notable events documented.   Last Vitals:  Vitals:   08/22/21 0452 08/22/21 0754  BP: 118/60 116/66  Pulse: 66 (!) 58  Resp: 19 16  Temp: (!) 36.3 C 36.5 C  SpO2: 98% 100%    Last Pain:  Vitals:   08/22/21 0754  TempSrc: Oral  PainSc:                  [name]

## 2021-08-22 NOTE — Progress Notes (Signed)
  Subjective: 1 Day Post-Op Procedure(s) (LRB): COMPUTER ASSISTED TOTAL KNEE ARTHROPLASTY - RNFA (Left) Patient reports pain as mild.   Patient is well, and has had no acute complaints or problems Plan is to go Home after hospital stay. Negative for chest pain and shortness of breath Fever: no Gastrointestinal:Negative for nausea and vomiting Patient reports that he was able to urinate this AM.  No BM but he is passing gas without pain. BP 116/66.  Objective: Vital signs in last 24 hours: Temp:  [97 F (36.1 C)-98.1 F (36.7 C)] 97.7 F (36.5 C) (09/24 0754) Pulse Rate:  [58-76] 58 (09/24 0754) Resp:  [13-19] 16 (09/24 0754) BP: (100-133)/(56-78) 116/66 (09/24 0754) SpO2:  [95 %-100 %] 100 % (09/24 0754) Weight:  [102 kg] 107 kg (09/23 1020)  Intake/Output from previous day:  Intake/Output Summary (Last 24 hours) at 08/22/2021 0954 Last data filed at 08/22/2021 0321 Gross per 24 hour  Intake 2734.54 ml  Output 1140 ml  Net 1594.54 ml    Intake/Output this shift: No intake/output data recorded.  Labs: No results for input(s): HGB in the last 72 hours. No results for input(s): WBC, RBC, HCT, PLT in the last 72 hours. No results for input(s): NA, K, CL, CO2, BUN, CREATININE, GLUCOSE, CALCIUM in the last 72 hours. No results for input(s): LABPT, INR in the last 72 hours.   EXAM General - Patient is Alert, Appropriate, and Oriented Extremity - ABD soft Neurovascular intact Sensation intact distally Intact pulses distally Dorsiflexion/Plantar flexion intact Dressing/Incision - Bulky dressing intact to the left leg.  Bulky dressing and hemovac removed, Honeycomb with at most mild bloody drainage along the middle of the incision.  4x4 with tegaderm applied. Motor Function - intact, moving foot and toes well on exam.  Abdomen soft with normal bowel sounds.  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   Weight as of this encounter: 107 kg. Advance diet Up with therapy D/C IV fluids when tolerating po intake.  Vitals reviewed. Bulky dressing removed, hemovac removed.  Honeycomb intact. Flomax added to aid with urination.  Patient was able to urinate this AM. Up with therapy this morning, has done extremely well. Plan for discharge home this afternoon with HHPT to visit him tomorrow.  DVT Prophylaxis - Aspirin, Foot Pumps, and Plavix Weight-Bearing as tolerated to left leg  J. Cameron Proud, PA-C [facility] Orthopaedic Surgery 08/22/2021, 9:54 AM

## 2021-08-22 NOTE — Discharge Summary (Signed)
Physician Discharge Summary  Patient ID: Seth Holt MRN: [id number] DOB/AGE: 74-Feb-1947 74 y.o.  Admit date: 08/21/2021 Discharge date: 08/22/2021  Admission Diagnoses:  Total knee replacement status [Z96.659]  Discharge Diagnoses: Patient Active Problem List   Diagnosis Date Noted   Total knee replacement status 08/21/2021   Primary osteoarthritis of left knee 06/21/2021   Carotid stenosis, left 12/22/2020   Asthma 12/16/2020   Colon polyps 12/16/2020   Carotid stenosis 12/16/2020   Amaurosis fugax 12/16/2020   Hyperlipidemia 12/16/2020   S/P total knee arthroplasty 08/18/2016    Past Medical History:  Diagnosis Date   Asthma    enviromental allergies   Cancer (HCC)    GERD (gastroesophageal reflux disease)    HOH (hard of hearing)    AIDS   Stroke ([place])    after carotid stent placement plaque broke loose and 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   Consultants (if any):   Discharged Condition: Improved  Hospital Course: JHON MALLOZZI is an 75 y.o. male who was admitted 08/21/2021 with a diagnosis of primary degenerative arthrosis of the left knee and went to the operating room on 08/21/2021 and underwent the above named procedures.    Surgeries: Procedure(s): COMPUTER ASSISTED TOTAL KNEE ARTHROPLASTY - RNFA on 08/21/2021 Patient tolerated the surgery well. Taken to PACU where he was stabilized and then transferred to the orthopedic floor.  Continued on Plavix 75mg  daily in addition to aspirin 81mg  twice daily for DVT prophylaxis. Foot pumps applied bilaterally at 80 mm. Heels elevated on bed with rolled towels. No evidence of DVT. Negative Homan. Physical therapy started on day #1 for gait training and transfer. OT started day #1 for ADL and assisted devices.  Patient was able to perform extremely well with therapy.  Patient's IV and hemovac were removed on POD1.  Foley removed shortly after surgery.  Implants:  DePuy Attune size 7 posterior stabilized femoral component (cemented), size 8 rotating platform tibial component (cemented), 38 mm medialized dome patella (cemented), and a 5 mm stabilized rotating platform polyethylene insert.  He was given perioperative antibiotics:  Anti-infectives (From admission, onward)    Start     Dose/Rate Route Frequency Ordered Stop   08/21/21 1800  ceFAZolin (ANCEF) IVPB 2g/100 mL premix        2 g 200 mL/hr over 30 Minutes Intravenous Every 6 hours 08/21/21 1535 08/22/21 0212   08/21/21 1012  ceFAZolin (ANCEF) 2-4 GM/100ML-% IVPB       Note to Pharmacy: Tobin Chad, Eve   : cabinet override      08/21/21 1012 08/22/21 0212   08/21/21 0600  ceFAZolin (ANCEF) IVPB 2g/100 mL premix        2 g 200 mL/hr over 30 Minutes Intravenous On call to O.R. 08/21/21 4098 08/21/21 1139     .  He was given sequential compression devices, early ambulation, and Plavix for DVT prophylaxis.  He benefited maximally from the hospital stay and there were no complications.    Recent vital signs:  Vitals:   08/22/21 0452 08/22/21 0754  BP: 118/60 116/66  Pulse: 66 (!) 58  Resp: 19 16  Temp: (!) 97.4 F (36.3 C) 97.7 F (36.5 C)  SpO2: 98% 100%    Recent laboratory studies:  Lab Results  Component Value Date   HGB 15.1 08/11/2021   HGB 13.5 12/23/2020   HGB 11.5 (L) 08/20/2016   Lab Results  Component Value  Date   WBC 7.8 08/11/2021   PLT 229 08/11/2021   Lab Results  Component Value Date   INR 1.1 08/11/2021   Lab Results  Component Value Date   NA 138 08/11/2021   K 3.7 08/11/2021   CL 103 08/11/2021   CO2 27 08/11/2021   BUN 10 08/11/2021   CREATININE 0.79 08/11/2021   GLUCOSE 89 08/11/2021    Discharge Medications:   Allergies as of 08/22/2021   No Known Allergies      Medication List     TAKE these medications    acetaminophen 500 MG tablet Commonly known as: TYLENOL Chew 1,000 mg by mouth every 6 (six) hours as needed for pain.   aspirin  81 MG EC tablet Take 1 tablet (81 mg total) by mouth 2 (two) times daily. Swallow whole. What changed: when to take this   atorvastatin 10 MG tablet Commonly known as: Lipitor Take 1 tablet (10 mg total) by mouth daily.   calcium carbonate 750 MG chewable tablet Commonly known as: TUMS EX Chew 1-2 tablets by mouth daily as needed for heartburn.   celecoxib 200 MG capsule Commonly known as: CELEBREX Take 1 capsule (200 mg total) by mouth 2 (two) times daily.   chlorpheniramine 4 MG tablet Commonly known as: CHLOR-TRIMETON Take 4 mg by mouth 2 (two) times daily as needed for allergies.   clopidogrel 75 MG tablet Commonly known as: PLAVIX Take 1 tablet by mouth once daily   diphenhydrAMINE 25 MG tablet Commonly known as: BENADRYL Take 50 mg by mouth at bedtime.   glucosamine-chondroitin 500-400 MG tablet Take 2 tablets by mouth in the morning.   hydroxypropyl methylcellulose / hypromellose 2.5 % ophthalmic solution Commonly known as: ISOPTO TEARS / GONIOVISC Place 1 drop into both eyes 3 (three) times daily as needed for dry eyes.   multivitamin capsule Take 1 capsule by mouth daily.   ondansetron 4 MG tablet Commonly known as: ZOFRAN Take 1 tablet (4 mg total) by mouth every 6 (six) hours as needed for nausea.   oxyCODONE 5 MG immediate release tablet Commonly known as: Oxy IR/ROXICODONE Take 1-2 tablets (5-10 mg total) by mouth every 4 (four) hours as needed for moderate pain (pain score 4-6).   traMADol 50 MG tablet Commonly known as: ULTRAM Take 1-2 tablets (50-100 mg total) by mouth every 6 (six) hours as needed for moderate pain.   TURMERIC-GINGER PO Take 1 capsule by mouth daily.               Durable Medical Equipment  (From admission, onward)           Start     Ordered   08/21/21 1536  DME Walker rolling  Once       Question:  Patient needs a walker to treat with the following condition  Answer:  Total knee replacement status   08/21/21  1535   08/21/21 1536  DME Bedside commode  Once       Question:  Patient needs a bedside commode to treat with the following condition  Answer:  Total knee replacement status   08/21/21 1535            Diagnostic Studies: DG Knee Left Port  Result Date: 08/21/2021 CLINICAL DATA:  Status post knee replacement EXAM: PORTABLE LEFT KNEE - 2 VIEW COMPARISON:  None. FINDINGS: Interval postsurgical changes from left total knee arthroplasty. Arthroplasty components appear in their expected alignment. No periprosthetic fracture is identified. Expected postoperative  changes within the overlying soft tissues. IMPRESSION: Expected postsurgical changes of left knee arthroplasty. Electronically Signed   By: Yetta Glassman M.D.   On: 08/21/2021 15:28    Disposition: Plan for discharge home this afternoon.   Follow-up Information     Raymone, [place], PA-C Follow up on 09/04/2021.   Specialty: Orthopedic Surgery Why: at 2:45pm Contact information: [place] [address] [phone]         Dereck Leep, MD Follow up on 10/08/2021.   Specialty: Orthopedic Surgery Why: at 3:45pm Contact information: [place] [address] [phone]                Signed: Judson Roch PA-C 08/22/2021, 9:59 AM

## 2021-08-22 NOTE — Progress Notes (Signed)
Physical Therapy Treatment Patient Details Name: Seth Holt MRN: [id number] DOB: 08/17/1946 Today's Date: 08/22/2021   History of Present Illness Pt is a 75 yo M diagnosed with degenerative arthrosis of the left knee and is s/p elective L TKA. PMH includes COPD, CVA, HOH, L carotid stent, and R TKA in 2017.    PT Comments    Patient is in bed upon PT arrival. He is agreeable to participation in PT session and eager to get out of bed. Patient transitioned EOB without assistance after performing bed strengthening/mobility interventions for warm up. Transitioned STS with RW with CGA/mod I. Patient negotiated obstacles in room with RW and ambulated to restroom where toilet transfers were performed with patient demonstrating safe functional transition from lowered position. Patient then ambulated lap around nursing station with PT guidance for decreasing reliance upon RW. Patient returned to chair with PA in room. PA notified by patient and PT of mobility status as well as patient not being able to urinate yet. Patient need's met and alarm set. Current POC remains appropriate at this time.     Recommendations for follow up therapy are one component of a multi-disciplinary discharge planning process, led by the attending physician.  Recommendations may be updated based on patient status, additional functional criteria and insurance authorization.  Follow Up Recommendations  Home health PT;Supervision for mobility/OOB     Equipment Recommendations  Other (comment) (Pt owns a RW but not a BSC. Pt declined a BSC stating that current setup would be sufficient.)    Recommendations for Other Services       Precautions / Restrictions Precautions Precautions: Knee Precaution Comments: TKE Restrictions Weight Bearing Restrictions: Yes LLE Weight Bearing: Weight bearing as tolerated Other Position/Activity Restrictions: Pt able to perform Ind LLE SLR without extensor lag, no KI required      Mobility  Bed Mobility Overal bed mobility: Independent             General bed mobility comments: patient transfers supine to sit without assistance    Transfers Overall transfer level: Modified independent Equipment used: Rolling walker (2 wheeled) Transfers: Sit to/from Stand Sit to Stand: Modified independent (Device/Increase time) Stand pivot transfers: Modified independent (Device/Increase time)       General transfer comment: Patient performed with slight increase in time from lower height toilet but able to perform without assitance.  Ambulation/Gait Ambulation/Gait assistance: Min guard Gait Distance (Feet): 215 Feet Assistive device: Rolling walker (2 wheeled) Gait Pattern/deviations: Step-through pattern;Decreased weight shift to left     General Gait Details: Patient decreasing need for assistance/pushing on RW. able to safely negotiate obstacles.   Stairs             Wheelchair Mobility    Modified Rankin (Stroke Patients Only)       Balance Overall balance assessment: Needs assistance   Sitting balance-Leahy Scale: Normal     Standing balance support: Single extremity supported;During functional activity Standing balance-Leahy Scale: Good Standing balance comment: able to stand and reach with SUE supported, ambulate with RW with decreasing support requried.                            Cognition Arousal/Alertness: Awake/alert Behavior During Therapy: WFL for tasks assessed/performed Overall Cognitive Status: Within Functional Limits for tasks assessed                                          Exercises Total Joint Exercises Ankle Circles/Pumps: AROM;Strengthening;Both;15 reps Heel Slides: Strengthening;Both;15 reps;Supine Straight Leg Raises: AROM;Strengthening;Left;5 reps Goniometric ROM: L knee AROM 0-89 degrees Other Exercises Other Exercises: educated on role of PT in acute care setting, safe mobility  and transfers, and gait mechanics. Transfer training from standard height toilet, standing reach tasks for weight shift.    General Comments        Pertinent Vitals/Pain Pain Assessment: No/denies pain Pain Score: 2  Pain Location: L knee Pain Descriptors / Indicators: Sore Pain Intervention(s): Monitored during session;Repositioned    Home Living Family/patient expects to be discharged to:: Private residence Living Arrangements: Spouse/significant other Available Help at Discharge: Family;Available 24 hours/day Type of Home: House Home Access: Stairs to enter   Home Layout: Two level;Able to live on main level with bedroom/bathroom Home Equipment: Gilford Rile - 2 wheels      Prior Function Level of Independence: Independent      Comments: Independent in all ADL/IADL PTA   PT Goals (current goals can now be found in the care plan section) Acute Rehab PT Goals Patient Stated Goal: To get back to being able to do yard work and hike PT Goal Formulation: With patient Time For Goal Achievement: 09/03/21 Potential to Achieve Goals: Good Progress towards PT goals: Progressing toward goals    Frequency    BID      PT Plan Current plan remains appropriate    Co-evaluation              AM-PAC PT "6 Clicks" Mobility   Outcome Measure  Help needed turning from your back to your side while in a flat bed without using bedrails?: None Help needed moving from lying on your back to sitting on the side of a flat bed without using bedrails?: A Little Help needed moving to and from a bed to a chair (including a wheelchair)?: A Little Help needed standing up from a chair using your arms (e.g., wheelchair or bedside chair)?: A Little Help needed to walk in hospital room?: A Little Help needed climbing 3-5 steps with a railing? : A Little 6 Click Score: 19    End of Session Equipment Utilized During Treatment: Gait belt Activity Tolerance: Patient tolerated treatment  well Patient left: with call bell/phone within reach;with SCD's reapplied;Other (comment);in chair;with chair alarm set (Polar care to L knee) Nurse Communication: Mobility status;Weight bearing status PT Visit Diagnosis: Other abnormalities of gait and mobility (R26.89);Muscle weakness (generalized) (M62.81);Pain Pain - Right/Left: Left Pain - part of body: Knee     Time: 1030-1314 PT Time Calculation (min) (ACUTE ONLY): 26 min  Charges:  $Therapeutic Exercise: 8-22 mins $Therapeutic Activity: 8-22 mins                    Janna Arch, PT, DPT  08/22/2021, 9:46 AM

## 2021-08-22 NOTE — Evaluation (Signed)
Occupational Therapy Evaluation Patient Details Name: Seth Holt MRN: [id number] DOB: 23-Jun-1946 Today's Date: 08/22/2021   History of Present Illness Pt is a 75 yo M diagnosed with degenerative arthrosis of the left knee and is s/p elective L TKA. PMH includes COPD, CVA, HOH, L carotid stent, and R TKA in 2017.   Clinical Impression   Chart reviewed, pt greeted in chair +IV, agreeable to OT evaluation. Pt reports 2/10 pain with activity. Pt provided educational hand out re: safe completion of ADL/IADL s/p TKA. Pt accepts information, performs teach back appropriately, and all questions answered accordingly. Pt was Ind-MOD I in ADL/IADL PTA, good recall of precautions and safe ADL completion following previous R TKA. Pt reports wife will assist as needed following discharge. Pt is left as received, NAD, all needs met. RN aware of pt status. OT will continue to follow while admitted to facilitate safe ADL/IADL completion due to impaired balance, no further OT recommended following discharge.      Recommendations for follow up therapy are one component of a multi-disciplinary discharge planning process, led by the attending physician.  Recommendations may be updated based on patient status, additional functional criteria and insurance authorization.   Follow Up Recommendations  No OT follow up    Equipment Recommendations  3 in 1 bedside commode;Other (comment) (sock aid- pt has information to purchase if he requires s/p discharge)    Recommendations for Other Services       Precautions / Restrictions Precautions Precautions: Fall Restrictions Weight Bearing Restrictions: Yes LLE Weight Bearing: Weight bearing as tolerated      Mobility Bed Mobility               General bed mobility comments: NT pt recieved in chair    Transfers Overall transfer level: Modified independent Equipment used: Rolling walker (2 wheeled) Transfers: Sit to/from [employer] Sit to Stand: Modified independent (Device/Increase time) Stand pivot transfers: Modified independent (Device/Increase time)            Balance Overall balance assessment: Needs assistance   Sitting balance-Leahy Scale: Normal       Standing balance-Leahy Scale: Good                             ADL either performed or assessed with clinical judgement   ADL Overall ADL's : Needs assistance/impaired     Grooming: Wash/dry hands;Standing;Supervision/safety Grooming Details (indicate cue type and reason): vcs for walker use                     Toileting- Clothing Manipulation and Hygiene: Supervision/safety Toileting - Clothing Manipulation Details (indicate cue type and reason): standing to urinate at toilet, distant sup     Functional mobility during ADLs: Modified independent General ADL Comments: with RW                         Pertinent Vitals/Pain Pain Assessment: No/denies pain Pain Score: 2  Pain Location: L knee Pain Descriptors / Indicators: Sore Pain Intervention(s): Monitored during session;Repositioned     Hand Dominance     Extremity/Trunk Assessment Upper Extremity Assessment Upper Extremity Assessment: Overall WFL for tasks assessed   Lower Extremity Assessment Lower Extremity Assessment: Generalized weakness       Communication Communication Communication: No difficulties   Cognition Arousal/Alertness: Awake/alert Behavior During Therapy: WFL for tasks assessed/performed Overall Cognitive Status: Within Functional Limits  for tasks assessed                                                Home Living Family/patient expects to be discharged to:: Private residence Living Arrangements: Spouse/significant other Available Help at Discharge: Family;Available 24 hours/day Type of Home: House Home Access: Stairs to enter [employer] of Steps: 2   Home Layout: Two level;Able to  live on main level with bedroom/bathroom               Home Equipment: Walker - 2 wheels          Prior Functioning/Environment Level of Independence: Independent        Comments: Independent in all ADL/IADL PTA        OT Problem List: Decreased strength;Impaired balance (sitting and/or standing)      OT Treatment/Interventions: Self-care/ADL training;Therapeutic exercise;DME and/or AE instruction;Therapeutic activities;Patient/family education    OT Goals(Current goals can be found in the care plan section) Acute Rehab OT Goals Patient Stated Goal: to go home OT Goal Formulation: With patient Time For Goal Achievement: 09/05/21 Potential to Achieve Goals: Good ADL Goals Pt Will Perform Lower Body Dressing: with modified independence Pt Will Perform Toileting - Clothing Manipulation and hygiene: with modified independence  OT Frequency: Min 1X/week    AM-PAC OT "6 Clicks" Daily Activity     Outcome Measure Help from another person eating meals?: None Help from another person taking care of personal grooming?: None Help from another person toileting, which includes using toliet, bedpan, or urinal?: None Help from another person bathing (including washing, rinsing, drying)?: A Little Help from another person to put on and taking off regular upper body clothing?: None Help from another person to put on and taking off regular lower body clothing?: A Little 6 Click Score: 22   End of Session Equipment Utilized During Treatment: Rolling walker Nurse Communication: Mobility status;Other (comment) (pt able to urinate during eval)  Activity Tolerance: Patient tolerated treatment well Patient left: in chair;with call bell/phone within reach;with SCD's reapplied  OT Visit Diagnosis: Unsteadiness on feet (R26.81);Other abnormalities of gait and mobility (R26.89)                Time: 3536-1443 OT Time Calculation (min): 20 min Charges:  OT General Charges $OT Visit: 1  Visit OT Evaluation $OT Eval Low Complexity: 1 Low OT Treatments $Self Care/Home Management : 8-22 mins  Shanon Payor, OTD OTR/L  08/22/21, 9:42 AM

## 2021-08-23 DIAGNOSIS — Z8673 Personal history of transient ischemic attack (TIA), and cerebral infarction without residual deficits: Secondary | ICD-10-CM | POA: Diagnosis not present

## 2021-08-23 DIAGNOSIS — K219 Gastro-esophageal reflux disease without esophagitis: Secondary | ICD-10-CM | POA: Diagnosis not present

## 2021-08-23 DIAGNOSIS — Z7902 Long term (current) use of antithrombotics/antiplatelets: Secondary | ICD-10-CM | POA: Diagnosis not present

## 2021-08-23 DIAGNOSIS — Z7982 Long term (current) use of aspirin: Secondary | ICD-10-CM | POA: Diagnosis not present

## 2021-08-23 DIAGNOSIS — Z859 Personal history of malignant neoplasm, unspecified: Secondary | ICD-10-CM | POA: Diagnosis not present

## 2021-08-23 DIAGNOSIS — Z96653 Presence of artificial knee joint, bilateral: Secondary | ICD-10-CM | POA: Diagnosis not present

## 2021-08-23 DIAGNOSIS — J45909 Unspecified asthma, uncomplicated: Secondary | ICD-10-CM | POA: Diagnosis not present

## 2021-08-23 DIAGNOSIS — E785 Hyperlipidemia, unspecified: Secondary | ICD-10-CM | POA: Diagnosis not present

## 2021-08-23 DIAGNOSIS — H9190 Unspecified hearing loss, unspecified ear: Secondary | ICD-10-CM | POA: Diagnosis not present

## 2021-08-23 DIAGNOSIS — Z471 Aftercare following joint replacement surgery: Secondary | ICD-10-CM | POA: Diagnosis not present

## 2021-08-23 DIAGNOSIS — Z87891 Personal history of nicotine dependence: Secondary | ICD-10-CM | POA: Diagnosis not present

## 2021-08-23 DIAGNOSIS — Z86010 Personal history of colonic polyps: Secondary | ICD-10-CM | POA: Diagnosis not present

## 2021-08-24 ENCOUNTER — Encounter: Payer: Self-pay | Admitting: Orthopedic Surgery

## 2021-08-27 DIAGNOSIS — Z471 Aftercare following joint replacement surgery: Secondary | ICD-10-CM | POA: Diagnosis not present

## 2021-08-31 DIAGNOSIS — H9190 Unspecified hearing loss, unspecified ear: Secondary | ICD-10-CM | POA: Diagnosis not present

## 2021-08-31 DIAGNOSIS — K219 Gastro-esophageal reflux disease without esophagitis: Secondary | ICD-10-CM | POA: Diagnosis not present

## 2021-08-31 DIAGNOSIS — Z8673 Personal history of transient ischemic attack (TIA), and cerebral infarction without residual deficits: Secondary | ICD-10-CM | POA: Diagnosis not present

## 2021-08-31 DIAGNOSIS — J45909 Unspecified asthma, uncomplicated: Secondary | ICD-10-CM | POA: Diagnosis not present

## 2021-08-31 DIAGNOSIS — Z96653 Presence of artificial knee joint, bilateral: Secondary | ICD-10-CM | POA: Diagnosis not present

## 2021-08-31 DIAGNOSIS — Z859 Personal history of malignant neoplasm, unspecified: Secondary | ICD-10-CM | POA: Diagnosis not present

## 2021-08-31 DIAGNOSIS — Z86010 Personal history of colonic polyps: Secondary | ICD-10-CM | POA: Diagnosis not present

## 2021-08-31 DIAGNOSIS — Z7982 Long term (current) use of aspirin: Secondary | ICD-10-CM | POA: Diagnosis not present

## 2021-08-31 DIAGNOSIS — Z471 Aftercare following joint replacement surgery: Secondary | ICD-10-CM | POA: Diagnosis not present

## 2021-08-31 DIAGNOSIS — Z7902 Long term (current) use of antithrombotics/antiplatelets: Secondary | ICD-10-CM | POA: Diagnosis not present

## 2021-08-31 DIAGNOSIS — Z87891 Personal history of nicotine dependence: Secondary | ICD-10-CM | POA: Diagnosis not present

## 2021-08-31 DIAGNOSIS — E785 Hyperlipidemia, unspecified: Secondary | ICD-10-CM | POA: Diagnosis not present

## 2021-09-04 DIAGNOSIS — M25562 Pain in left knee: Secondary | ICD-10-CM | POA: Diagnosis not present

## 2021-09-04 DIAGNOSIS — R29898 Other symptoms and signs involving the musculoskeletal system: Secondary | ICD-10-CM | POA: Diagnosis not present

## 2021-09-04 DIAGNOSIS — M25662 Stiffness of left knee, not elsewhere classified: Secondary | ICD-10-CM | POA: Diagnosis not present

## 2021-09-04 DIAGNOSIS — Z96652 Presence of left artificial knee joint: Secondary | ICD-10-CM | POA: Diagnosis not present

## 2021-09-08 DIAGNOSIS — Z96652 Presence of left artificial knee joint: Secondary | ICD-10-CM | POA: Diagnosis not present

## 2021-09-08 DIAGNOSIS — M25562 Pain in left knee: Secondary | ICD-10-CM | POA: Diagnosis not present

## 2021-09-11 DIAGNOSIS — Z96652 Presence of left artificial knee joint: Secondary | ICD-10-CM | POA: Diagnosis not present

## 2021-09-11 DIAGNOSIS — M25562 Pain in left knee: Secondary | ICD-10-CM | POA: Diagnosis not present

## 2021-09-17 DIAGNOSIS — Z96652 Presence of left artificial knee joint: Secondary | ICD-10-CM | POA: Diagnosis not present

## 2021-09-17 DIAGNOSIS — M25562 Pain in left knee: Secondary | ICD-10-CM | POA: Diagnosis not present

## 2021-10-07 ENCOUNTER — Other Ambulatory Visit (INDEPENDENT_AMBULATORY_CARE_PROVIDER_SITE_OTHER): Payer: Self-pay | Admitting: Vascular Surgery

## 2021-10-08 DIAGNOSIS — Z96652 Presence of left artificial knee joint: Secondary | ICD-10-CM | POA: Diagnosis not present

## 2021-10-15 DIAGNOSIS — L570 Actinic keratosis: Secondary | ICD-10-CM | POA: Diagnosis not present

## 2021-10-15 DIAGNOSIS — D1801 Hemangioma of skin and subcutaneous tissue: Secondary | ICD-10-CM | POA: Diagnosis not present

## 2021-10-15 DIAGNOSIS — L578 Other skin changes due to chronic exposure to nonionizing radiation: Secondary | ICD-10-CM | POA: Diagnosis not present

## 2021-10-15 DIAGNOSIS — Q828 Other specified congenital malformations of skin: Secondary | ICD-10-CM | POA: Diagnosis not present

## 2021-10-15 DIAGNOSIS — Z85828 Personal history of other malignant neoplasm of skin: Secondary | ICD-10-CM | POA: Diagnosis not present

## 2021-10-15 DIAGNOSIS — L821 Other seborrheic keratosis: Secondary | ICD-10-CM | POA: Diagnosis not present

## 2021-10-30 ENCOUNTER — Ambulatory Visit (INDEPENDENT_AMBULATORY_CARE_PROVIDER_SITE_OTHER): Payer: PPO | Admitting: Vascular Surgery

## 2021-10-30 ENCOUNTER — Other Ambulatory Visit: Payer: Self-pay

## 2021-10-30 ENCOUNTER — Ambulatory Visit (INDEPENDENT_AMBULATORY_CARE_PROVIDER_SITE_OTHER): Payer: PPO

## 2021-10-30 VITALS — BP 147/81 | HR 71 | Ht 71.0 in | Wt 233.0 lb

## 2021-10-30 DIAGNOSIS — I6523 Occlusion and stenosis of bilateral carotid arteries: Secondary | ICD-10-CM

## 2021-10-30 DIAGNOSIS — E785 Hyperlipidemia, unspecified: Secondary | ICD-10-CM

## 2021-10-30 DIAGNOSIS — G453 Amaurosis fugax: Secondary | ICD-10-CM

## 2021-10-30 NOTE — Progress Notes (Signed)
MRN : [id number]  Seth Holt is a 75 y.o. (Oct 13, 1946) male who presents with chief complaint of  Chief Complaint  Patient presents with   Follow-up    6 Mo Carotid   .  History of Present Illness: Patient returns in follow-up of his carotid disease.  He is about 10 months status post left carotid stent placement for stenosis with amaurosis fugax.  He is doing well.  No recurrent symptoms.  He is tolerating aspirin, Plavix, and Lipitor.  His duplex today shows 1 to 39% right ICA stenosis and a widely patent left carotid stent.  Current Outpatient Medications  Medication Sig Dispense Refill   acetaminophen (TYLENOL) 500 MG tablet Chew 1,000 mg by mouth every 6 (six) hours as needed for pain.      aspirin 81 MG EC tablet Take 1 tablet (81 mg total) by mouth 2 (two) times daily. Swallow whole. 60 tablet 3   atorvastatin (LIPITOR) 10 MG tablet Take 1 tablet (10 mg total) by mouth daily. 30 tablet 11   calcium carbonate (TUMS EX) 750 MG chewable tablet Chew 1-2 tablets by mouth daily as needed for heartburn.      celecoxib (CELEBREX) 200 MG capsule Take 1 capsule (200 mg total) by mouth 2 (two) times daily. 60 capsule 0   chlorpheniramine (CHLOR-TRIMETON) 4 MG tablet Take 4 mg by mouth 2 (two) times daily as needed for allergies.     clopidogrel (PLAVIX) 75 MG tablet Take 1 tablet by mouth once daily 30 tablet 0   diphenhydrAMINE (BENADRYL) 25 MG tablet Take 50 mg by mouth at bedtime.     glucosamine-chondroitin 500-400 MG tablet Take 2 tablets by mouth in the morning.     hydroxypropyl methylcellulose / hypromellose (ISOPTO TEARS / GONIOVISC) 2.5 % ophthalmic solution Place 1 drop into both eyes 3 (three) times daily as needed for dry eyes.     Multiple Vitamin (MULTIVITAMIN) capsule Take 1 capsule by mouth daily.     ondansetron (ZOFRAN) 4 MG tablet Take 1 tablet (4 mg total) by mouth every 6 (six) hours as needed for nausea. 30 tablet 0   oxyCODONE (OXY IR/ROXICODONE) 5 MG immediate  release tablet Take 1-2 tablets (5-10 mg total) by mouth every 4 (four) hours as needed for moderate pain (pain score 4-6). 30 tablet 0   traMADol (ULTRAM) 50 MG tablet Take 1-2 tablets (50-100 mg total) by mouth every 6 (six) hours as needed for moderate pain. 30 tablet 0   TURMERIC-GINGER PO Take 1 capsule by mouth daily.     No current facility-administered medications for this visit.    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, has since resoved   Vertigo     Past Surgical History:  Procedure Laterality Date   CAROTID PTA/STENT INTERVENTION Left 12/22/2020   Procedure: CAROTID PTA/STENT INTERVENTION;  Surgeon: Algernon Huxley, MD;  Location: [place] [facility];  Service: Cardiovascular;  Laterality: Left;   CATARACT EXTRACTION W/PHACO Left 04/06/2018   Procedure: CATARACT EXTRACTION PHACO AND INTRAOCULAR LENS PLACEMENT (IOC);  Surgeon: Eulogio Bear, MD;  Location: [facility];  Service: Ophthalmology;  Laterality: Left;  [place] 00:19.8 AP% 5.8 CDE 1.15 Fluid pack lot #  [id number]   CATARACT EXTRACTION W/PHACO Right 05/04/2018   Procedure: CATARACT EXTRACTION PHACO AND INTRAOCULAR LENS PLACEMENT (IOC);  Surgeon: Eulogio Bear, MD;  Location: [facility];  Service: Ophthalmology;  Laterality: Right;  fluid pack lot # [id number]  exp 12/29/2018 [place] 00:27.7 AP% 7.1 CDE 1.98   COLONOSCOPY WITH PROPOFOL N/A 08/22/2019   Procedure: COLONOSCOPY WITH PROPOFOL;  Surgeon: Toledo, Benay Pike, MD;  Location: [facility];  Service: Gastroenterology;  Laterality: N/A;   EYE SURGERY     JOINT REPLACEMENT     TKR   KNEE ARTHROPLASTY Right 08/18/2016   Procedure: COMPUTER ASSISTED TOTAL KNEE ARTHROPLASTY;  Surgeon: Dereck Leep, MD;  Location: [facility];  Service: Orthopedics;   Laterality: Right;   KNEE ARTHROPLASTY Left 08/21/2021   Procedure: COMPUTER ASSISTED TOTAL KNEE ARTHROPLASTY - RNFA;  Surgeon: Dereck Leep, MD;  Location: [facility];  Service: Orthopedics;  Laterality: Left;   LACERATION REPAIR Left    thumb     Social History   Tobacco Use   Smoking status: Light Smoker    Types: Pipe   Smokeless tobacco: Never  Vaping Use   Vaping Use: Never used  Substance Use Topics   Alcohol use: No   Drug use: No       Family History  Problem Relation Age of Onset   Colon cancer Father    Hypertension Father    Arthritis Father       No Known Allergies  REVIEW OF SYSTEMS (Negative unless checked)   Constitutional: [] Weight loss  [] Fever  [] Chills Cardiac: [] Chest pain   [] Chest pressure   [] Palpitations   [] Shortness of breath when laying flat   [] Shortness of breath at rest   [] Shortness of breath with exertion. Vascular:  [] Pain in legs with walking   [] Pain in legs at rest   [] Pain in legs when laying flat   [] Claudication   [] Pain in feet when walking  [] Pain in feet at rest  [] Pain in feet when laying flat   [] History of DVT   [] Phlebitis   [] Swelling in legs   [] Varicose veins   [] Non-healing ulcers Pulmonary:   [] Uses home oxygen   [] Productive cough   [] Hemoptysis   [] Wheeze  [] COPD   [x] Asthma Neurologic:  [] Dizziness  [] Blackouts   [] Seizures   [] History of stroke   [] History of TIA  [] Aphasia   [x] Temporary blindness   [] Dysphagia   [] Weakness or numbness in arms   [] Weakness or numbness in legs Musculoskeletal:  [x] Arthritis   [] Joint swelling   [x] Joint pain   [] Low back pain Hematologic:  [] Easy bruising  [] Easy bleeding   [] Hypercoagulable state   [] Anemic  [] Hepatitis Gastrointestinal:  [] Blood in stool   [] Vomiting blood  [x] Gastroesophageal reflux/heartburn   [] Abdominal pain Genitourinary:  [] Chronic kidney disease   [] Difficult urination  [] Frequent urination  [] Burning with urination   [] Hematuria Skin:  [] Rashes   [] Ulcers    [] Wounds Psychological:  [] History of anxiety   []  History of major depression.  Physical Examination  Vitals:   10/30/21 1019  BP: (!) 147/81  Pulse: 71  Weight: 233 lb (105.7 kg)  Height: 5\' 11"  (1.803 m)   Body mass index is 32.5 kg/m. Gen:  WD/WN, NAD. Appears younger than stated age. Head: [place]/AT, No temporalis wasting. Ear/Nose/Throat: Hearing grossly intact, nares w/o erythema or drainage, trachea midline Eyes: Conjunctiva clear. Sclera non-icteric Neck: Supple.  No bruit  Pulmonary:  Good air movement, equal and clear to auscultation  bilaterally.  Cardiac: RRR, No JVD Vascular:  Vessel Right Left  Radial Palpable Palpable       Musculoskeletal: M/S 5/5 throughout.  No deformity or atrophy. No edema. Neurologic: CN 2-12 intact. Sensation grossly intact in extremities.  Symmetrical.  Speech is fluent. Motor exam as listed above. Psychiatric: Judgment intact, Mood & affect appropriate for pt's clinical situation. Dermatologic: No rashes or ulcers noted.  No cellulitis or open wounds.     CBC Lab Results  Component Value Date   WBC 7.8 08/11/2021   HGB 15.1 08/11/2021   HCT 43.3 08/11/2021   MCV 91.0 08/11/2021   PLT 229 08/11/2021    BMET    Component Value Date/Time   NA 138 08/11/2021 1436   K 3.7 08/11/2021 1436   CL 103 08/11/2021 1436   CO2 27 08/11/2021 1436   GLUCOSE 89 08/11/2021 1436   BUN 10 08/11/2021 1436   CREATININE 0.79 08/11/2021 1436   CALCIUM 9.3 08/11/2021 1436   GFRNONAA >60 08/11/2021 1436   GFRAA >60 08/20/2016 0628   CrCl cannot be calculated (Patient's most recent lab result is older than the maximum 21 days allowed.).  COAG Lab Results  Component Value Date   INR 1.1 08/11/2021   INR 1.02 08/04/2016    Radiology No results found.   Assessment/Plan Amaurosis fugax 2 episodes.  Likely from his carotid disease.  Now s/p intervention without recurrence   Hyperlipidemia lipid control important in reducing the  progression of atherosclerotic disease. Start statin therapy both to treat his hyperlipidemia as well as to minimize progression of his carotid disease  Carotid stenosis His duplex today shows 1 to 39% right ICA stenosis and a widely patent left carotid stent.  Continue current medical regimen.  Recheck in 6 months.    Leotis Pain, MD  10/30/2021 10:50 AM    This note was created with Dragon medical transcription system.  Any errors from dictation are purely unintentional

## 2021-10-30 NOTE — Assessment & Plan Note (Signed)
His duplex today shows 1 to 39% right ICA stenosis and a widely patent left carotid stent.  Continue current medical regimen.  Recheck in 6 months.

## 2021-11-18 ENCOUNTER — Other Ambulatory Visit (INDEPENDENT_AMBULATORY_CARE_PROVIDER_SITE_OTHER): Payer: Self-pay

## 2021-11-18 ENCOUNTER — Telehealth (INDEPENDENT_AMBULATORY_CARE_PROVIDER_SITE_OTHER): Payer: Self-pay

## 2021-11-18 MED ORDER — CLOPIDOGREL BISULFATE 75 MG PO TABS: 75.0000 mg | ORAL_TABLET | Freq: Every day | ORAL | 11 refills | Status: AC

## 2021-11-18 NOTE — Telephone Encounter (Signed)
Patient called in requesting a refill on his medication   clopidogrel (PLAVIX) 75 MG tablet  [place], [place]

## 2021-11-18 NOTE — Telephone Encounter (Signed)
Patient was made aware that requesting prescription has been e-scribe to [facility] on [address].

## 2021-12-09 ENCOUNTER — Other Ambulatory Visit (INDEPENDENT_AMBULATORY_CARE_PROVIDER_SITE_OTHER): Payer: Self-pay | Admitting: Vascular Surgery

## 2022-01-06 ENCOUNTER — Other Ambulatory Visit (INDEPENDENT_AMBULATORY_CARE_PROVIDER_SITE_OTHER): Payer: Self-pay | Admitting: Vascular Surgery

## 2022-01-11 ENCOUNTER — Telehealth (INDEPENDENT_AMBULATORY_CARE_PROVIDER_SITE_OTHER): Payer: Self-pay | Admitting: Nurse Practitioner

## 2022-01-11 ENCOUNTER — Other Ambulatory Visit (INDEPENDENT_AMBULATORY_CARE_PROVIDER_SITE_OTHER): Payer: Self-pay | Admitting: Nurse Practitioner

## 2022-01-11 MED ORDER — ATORVASTATIN CALCIUM 10 MG PO TABS: 10.0000 mg | ORAL_TABLET | Freq: Every day | ORAL | 3 refills | Status: AC

## 2022-01-11 NOTE — Telephone Encounter (Signed)
Sent!

## 2022-01-11 NOTE — Telephone Encounter (Signed)
Seth Holt called stating Seth Holt needed to get his prescription for  Atorvastatin Calcium 10 MG Take 1 tablet by mouth once daily refilled.  She states Dr. Lucky Cowboy told her Seth Holt needed to get his PCP to refill the prescription because he could not refill it anymore.  Seth Holt asked if the prescription could be filled once again until they can find a PCP for Seth Holt because he does not have one at the moment.

## 2022-01-26 IMAGING — US US CAROTID DUPLEX BILAT
1 series · 13 of 24 positions shown · non-contrast
Comparison: None.

CLINICAL DATA: 74-year-old with amaurosis fugax.

EXAM:
BILATERAL CAROTID DUPLEX ULTRASOUND
TECHNIQUE: Gray scale imaging, color Doppler and duplex ultrasound were
performed of bilateral carotid and vertebral arteries in the neck.

[Series 1: us carotid bilateral · 13 of 63 slices shown]
[im 1/63]
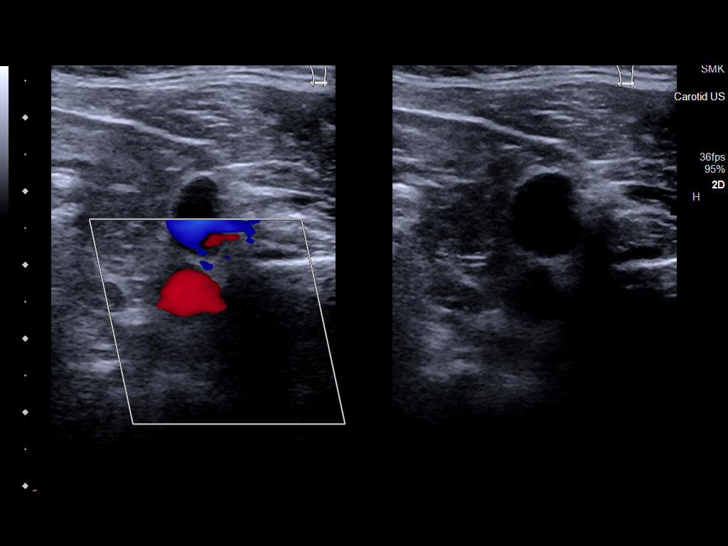
[im 6/63]
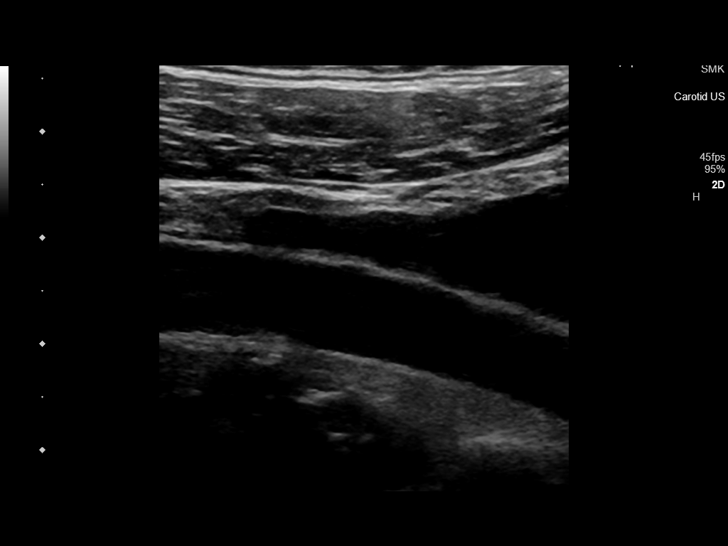
[im 11/63]
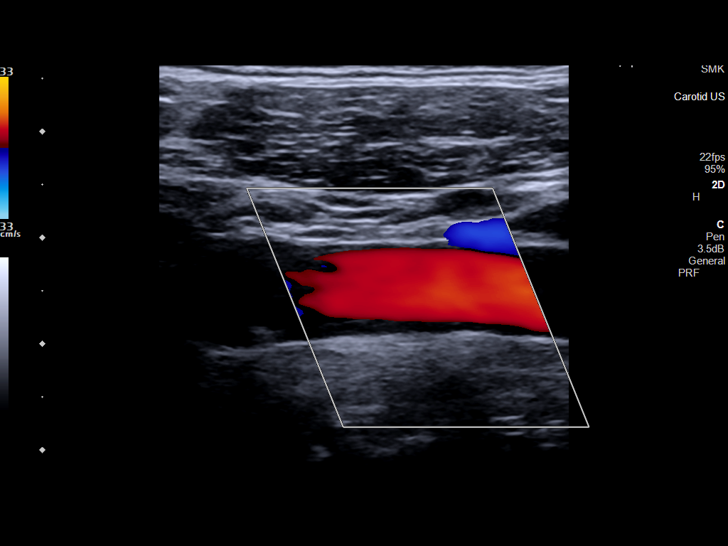
[im 17/63]
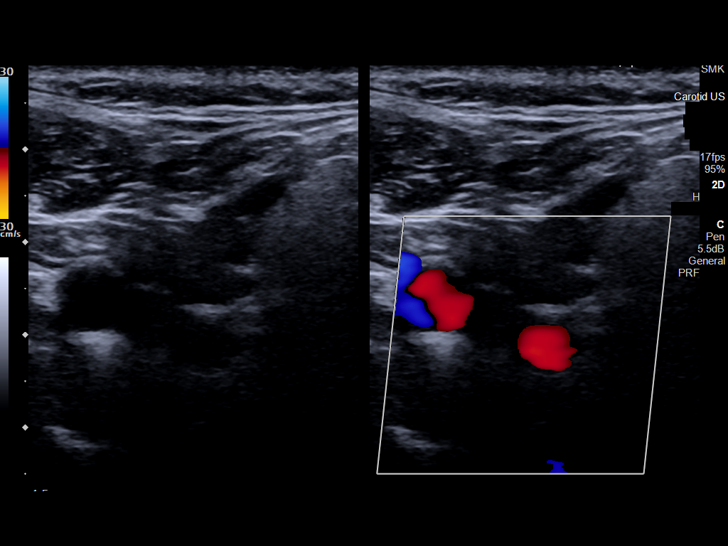
[im 22/63]
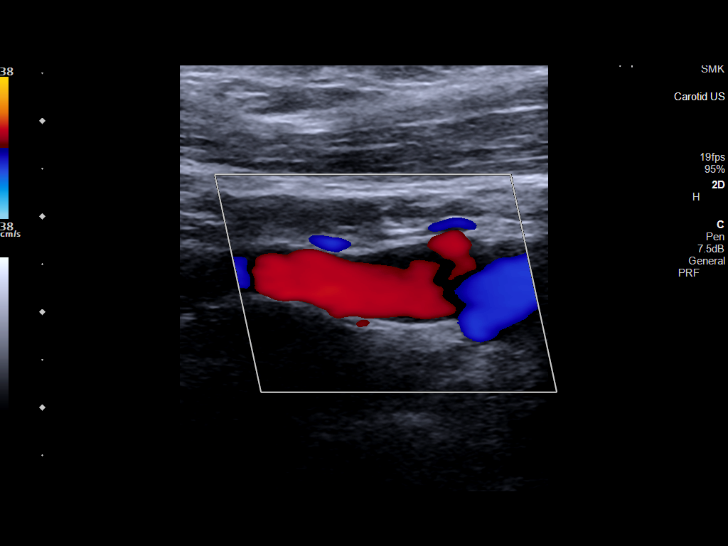
[im 27/63]
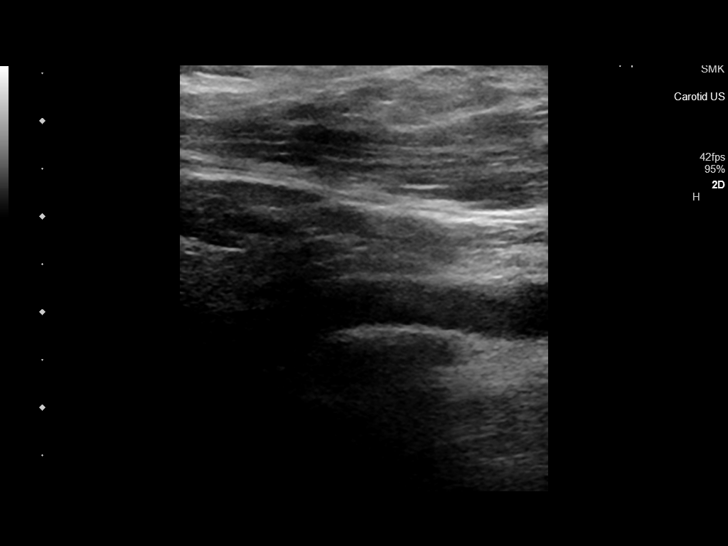
[im 33/63]
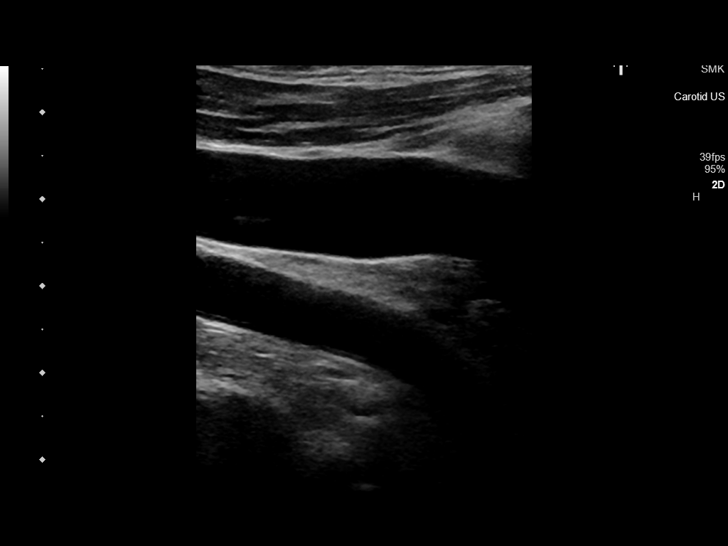
[im 36/63]
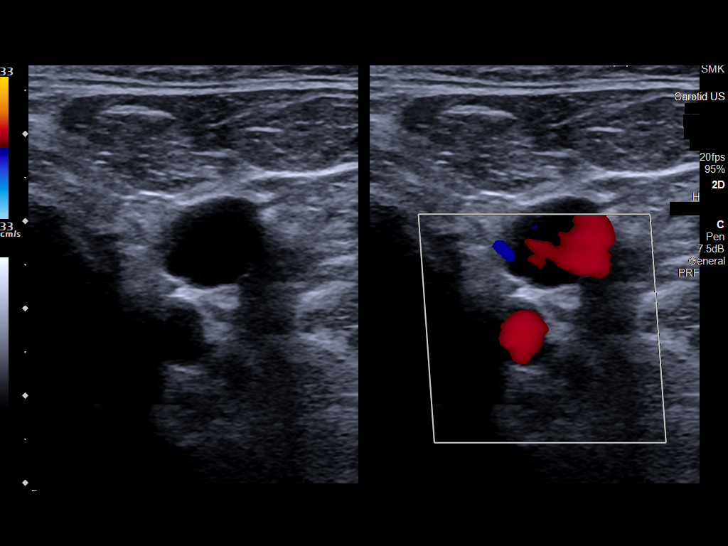
[im 41/63]
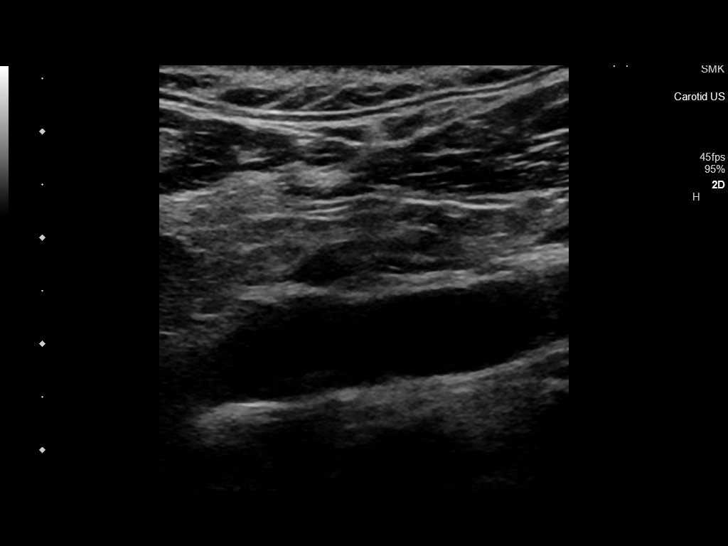
[im 46/63]
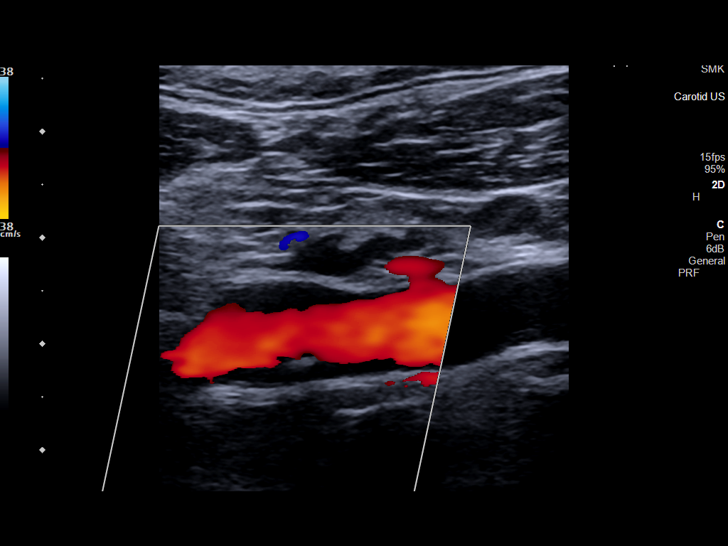
[im 52/63]
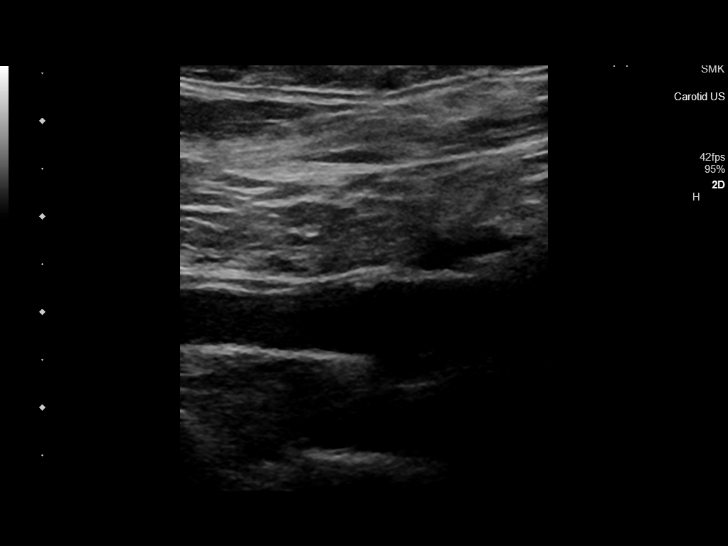
[im 57/63]
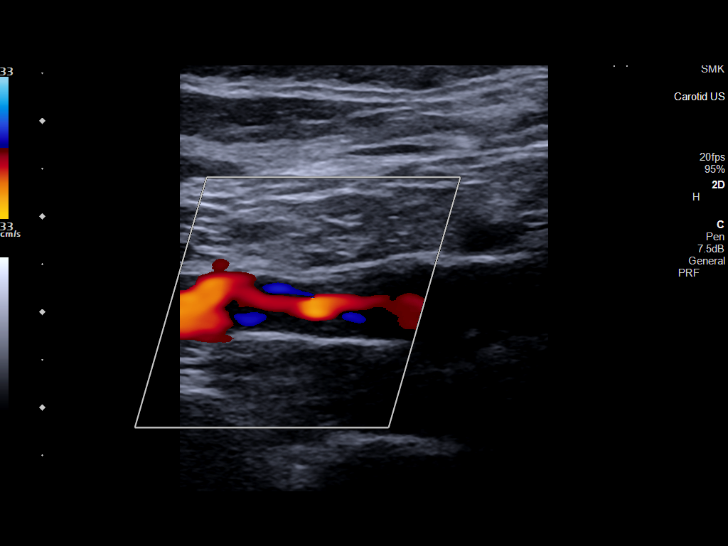
[im 63/63]
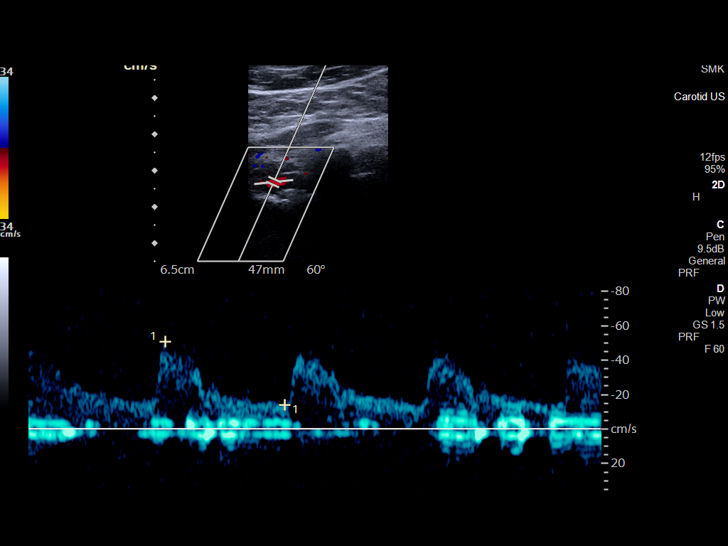

[13 of 24 positions shown; findings below may reference images not displayed]

FINDINGS: Criteria: Quantification of carotid stenosis is based on velocity
parameters that correlate the residual internal carotid diameter
with NASCET-based stenosis levels, using the diameter of the distal
internal carotid lumen as the denominator for stenosis measurement.

The following velocity measurements were obtained:

RIGHT

ICA: 85/30 cm/sec

CCA: 114/27 cm/sec

SYSTOLIC ICA/CCA RATIO:

ECA: 81 cm/sec

LEFT

ICA: 181/80 cm/sec

CCA: 112/19 cm/sec

SYSTOLIC ICA/CCA RATIO:

ECA: 102 cm/sec

RIGHT CAROTID ARTERY: Small amount of heterogeneous plaque in the
distal common carotid artery. External carotid artery is patent with
normal waveform. Normal waveforms and velocities in the internal
carotid artery.

RIGHT VERTEBRAL ARTERY: Antegrade flow and normal waveform in the
right vertebral artery.

LEFT CAROTID ARTERY: Small amount of echogenic plaque at the left
carotid bulb.External carotid artery is patent with normal waveform.
Heterogeneous plaque in the proximal internal carotid artery.
Narrowing in the proximal internal carotid artery based on the color
Doppler images. Peak systolic velocity in the left internal carotid
artery measures 181 cm/sec.

LEFT VERTEBRAL ARTERY: Antegrade flow and normal waveform in the
left vertebral artery.
IMPRESSION: 1. Atherosclerotic disease in the carotid arteries, most prominent
at the left internal carotid artery.
2. Estimated degree of stenosis in the left internal carotid artery
is 50-69%.
3. No significant stenosis in the right internal carotid artery and
estimated degree of stenosis is less than 50%.
4. Patent vertebral arteries with antegrade flow.

## 2022-04-30 ENCOUNTER — Encounter (INDEPENDENT_AMBULATORY_CARE_PROVIDER_SITE_OTHER): Payer: Self-pay | Admitting: Vascular Surgery

## 2022-04-30 ENCOUNTER — Ambulatory Visit (INDEPENDENT_AMBULATORY_CARE_PROVIDER_SITE_OTHER): Payer: PPO

## 2022-04-30 ENCOUNTER — Ambulatory Visit (INDEPENDENT_AMBULATORY_CARE_PROVIDER_SITE_OTHER): Payer: PPO | Admitting: Nurse Practitioner

## 2022-04-30 VITALS — BP 128/85 | HR 63 | Resp 16 | Ht 71.5 in | Wt 242.2 lb

## 2022-04-30 DIAGNOSIS — I6523 Occlusion and stenosis of bilateral carotid arteries: Secondary | ICD-10-CM

## 2022-04-30 DIAGNOSIS — E785 Hyperlipidemia, unspecified: Secondary | ICD-10-CM | POA: Diagnosis not present

## 2022-05-05 ENCOUNTER — Encounter (INDEPENDENT_AMBULATORY_CARE_PROVIDER_SITE_OTHER): Payer: Self-pay | Admitting: Nurse Practitioner

## 2022-05-05 NOTE — Progress Notes (Signed)
Subjective:    Patient ID: Seth Holt, male    DOB: 04/08/1946, 76 y.o.   MRN: [id number] No chief complaint on file.   The patient is seen for follow up evaluation of carotid stenosis. The carotid stenosis followed by ultrasound.  The patient has a previous history of a left carotid stent placement  The patient denies amaurosis fugax. There is no recent history of TIA symptoms or focal motor deficits. There is no prior documented CVA.  The patient is taking enteric-coated aspirin 81 mg daily.  There is no history of migraine headaches. There is no history of seizures.  No recent shortening of the patient's walking distance or new symptoms consistent with claudication.  No history of rest pain symptoms. No new ulcers or wounds of the lower extremities have occurred.  There is no history of DVT, PE or superficial thrombophlebitis. No recent episodes of angina or shortness of breath documented.   Carotid Duplex done today shows 1 to 39% bilaterally.  No change compared to last study in 11/02/2021    Review of Systems  Eyes:  Negative for visual disturbance.  All other systems reviewed and are negative.     Objective:   Physical Exam Vitals reviewed.  HENT:     Head: Normocephalic.  Cardiovascular:     Rate and Rhythm: Normal rate.     Pulses: Normal pulses.     Heart sounds: Normal heart sounds.  Pulmonary:     Effort: Pulmonary effort is normal.  Skin:    General: Skin is warm and dry.  Neurological:     Mental Status: He is alert and oriented to person, place, and time.  Psychiatric:        Mood and Affect: Mood normal.        Behavior: Behavior normal.        Thought Content: Thought content normal.        Judgment: Judgment normal.    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, has since resoved   Vertigo     Social History   Socioeconomic History   Marital status: Married    Spouse name: Not on file   Number of children: Not on file   Years of education: Not on file   Highest education level: Not on file  Occupational History   Not on file  Tobacco Use   Smoking status: Light Smoker    Types: Pipe   Smokeless tobacco: Never  Vaping Use   Vaping Use: Never used  Substance and Sexual Activity   Alcohol use: No   Drug use: No   Sexual activity: Not on file  Other Topics Concern   Not on file  Social History Narrative   Not on file   Social Determinants of Health   Financial Resource Strain: Not on file  Food Insecurity: Not on file  Transportation Needs: Not on file  Physical Activity: Not on file  Stress: Not on file  Social Connections:  Not on file  Intimate Partner Violence: Not on file    Past Surgical History:  Procedure Laterality Date   CAROTID PTA/STENT INTERVENTION Left 12/22/2020   Procedure: CAROTID PTA/STENT INTERVENTION;  Surgeon: Algernon Huxley, MD;  Location: [place] [facility];  Service: Cardiovascular;  Laterality: Left;   CATARACT EXTRACTION W/PHACO Left 04/06/2018   Procedure: CATARACT EXTRACTION PHACO AND INTRAOCULAR LENS PLACEMENT (IOC);  Surgeon: Eulogio Bear, MD;  Location: [facility];  Service: Ophthalmology;  Laterality: Left;  [place] 00:19.8 AP% 5.8 CDE 1.15 Fluid pack lot # [id number]   CATARACT EXTRACTION W/PHACO Right 05/04/2018   Procedure: CATARACT EXTRACTION PHACO AND INTRAOCULAR LENS PLACEMENT (IOC);  Surgeon: Eulogio Bear, MD;  Location: [facility];  Service: Ophthalmology;  Laterality: Right;  fluid pack lot # [id number] H  exp 12/29/2018 [place] 00:27.7 AP% 7.1 CDE 1.98   COLONOSCOPY WITH PROPOFOL N/A 08/22/2019   Procedure:  COLONOSCOPY WITH PROPOFOL;  Surgeon: Toledo, Benay Pike, MD;  Location: [facility];  Service: Gastroenterology;  Laterality: N/A;   EYE SURGERY     JOINT REPLACEMENT     TKR   KNEE ARTHROPLASTY Right 08/18/2016   Procedure: COMPUTER ASSISTED TOTAL KNEE ARTHROPLASTY;  Surgeon: Dereck Leep, MD;  Location: [facility];  Service: Orthopedics;  Laterality: Right;   KNEE ARTHROPLASTY Left 08/21/2021   Procedure: COMPUTER ASSISTED TOTAL KNEE ARTHROPLASTY - RNFA;  Surgeon: Dereck Leep, MD;  Location: [facility];  Service: Orthopedics;  Laterality: Left;   LACERATION REPAIR Left    thumb    Family History  Problem Relation Age of Onset   Colon cancer Father    Hypertension Father    Arthritis Father     No Known Allergies     Latest Ref Rng & Units 08/11/2021    2:36 PM 12/23/2020    4:53 AM 08/20/2016    6:28 AM  CBC  WBC 4.0 - 10.5 K/uL 7.8   11.1   8.4    Hemoglobin 13.0 - 17.0 g/dL 15.1   13.5   11.5    Hematocrit 39.0 - 52.0 % 43.3   38.7   32.1    Platelets 150 - 400 K/uL 229   213   183        CMP     Component Value Date/Time   NA 138 08/11/2021 1436   K 3.7 08/11/2021 1436   CL 103 08/11/2021 1436   CO2 27 08/11/2021 1436   GLUCOSE 89 08/11/2021 1436   BUN 10 08/11/2021 1436   CREATININE 0.79 08/11/2021 1436   CALCIUM 9.3 08/11/2021 1436   PROT 7.1 08/11/2021 1436   ALBUMIN 4.4 08/11/2021 1436   AST 18 08/11/2021 1436   ALT 15 08/11/2021 1436   ALKPHOS 52 08/11/2021 1436   BILITOT 0.7 08/11/2021 1436   GFRNONAA >60 08/11/2021 1436   GFRAA >60 08/20/2016 0628     No results found.     Assessment & Plan:   1. Bilateral carotid artery stenosis Recommend:  Given the patient's asymptomatic subcritical stenosis no further invasive testing or surgery at this time.  Duplex ultrasound shows <40% stenosis bilaterally.  Continue antiplatelet therapy as prescribed Continue management of CAD, HTN and Hyperlipidemia Healthy heart diet,  encouraged exercise at  least 4 times per week Follow up in 12 months with duplex ultrasound and physical exam    2. Hyperlipidemia, unspecified hyperlipidemia type Continue statin as ordered and reviewed, no changes at this time    Current Outpatient Medications  on File Prior to Visit  Medication Sig Dispense Refill   acetaminophen (TYLENOL) 500 MG tablet Chew 1,000 mg by mouth every 6 (six) hours as needed for pain.      aspirin 81 MG EC tablet Take 1 tablet (81 mg total) by mouth 2 (two) times daily. Swallow whole. 60 tablet 3   atorvastatin (LIPITOR) 10 MG tablet Take 1 tablet (10 mg total) by mouth daily. 30 tablet 3   calcium carbonate (TUMS EX) 750 MG chewable tablet Chew 1-2 tablets by mouth daily as needed for heartburn.      celecoxib (CELEBREX) 200 MG capsule Take 1 capsule (200 mg total) by mouth 2 (two) times daily. 60 capsule 0   chlorpheniramine (CHLOR-TRIMETON) 4 MG tablet Take 4 mg by mouth 2 (two) times daily as needed for allergies.     clopidogrel (PLAVIX) 75 MG tablet Take 1 tablet (75 mg total) by mouth daily. 30 tablet 11   diphenhydrAMINE (BENADRYL) 25 MG tablet Take 50 mg by mouth at bedtime.     glucosamine-chondroitin 500-400 MG tablet Take 2 tablets by mouth in the morning.     hydroxypropyl methylcellulose / hypromellose (ISOPTO TEARS / GONIOVISC) 2.5 % ophthalmic solution Place 1 drop into both eyes 3 (three) times daily as needed for dry eyes.     Multiple Vitamin (MULTIVITAMIN) capsule Take 1 capsule by mouth daily.     ondansetron (ZOFRAN) 4 MG tablet Take 1 tablet (4 mg total) by mouth every 6 (six) hours as needed for nausea. 30 tablet 0   oxyCODONE (OXY IR/ROXICODONE) 5 MG immediate release tablet Take 1-2 tablets (5-10 mg total) by mouth every 4 (four) hours as needed for moderate pain (pain score 4-6). 30 tablet 0   traMADol (ULTRAM) 50 MG tablet Take 1-2 tablets (50-100 mg total) by mouth every 6 (six) hours as needed for moderate pain. 30 tablet 0   TURMERIC-GINGER PO Take 1  capsule by mouth daily.     No current facility-administered medications on file prior to visit.    There are no Patient Instructions on file for this visit. No follow-ups on file.   Kris Hartmann, NP

## 2022-10-08 IMAGING — DX DG KNEE 1-2V PORT*L*
2 series · 2 of 2 positions shown · non-contrast
Comparison: None.

CLINICAL DATA: Status post knee replacement

EXAM:
PORTABLE LEFT KNEE - 2 VIEW

[knee ap]
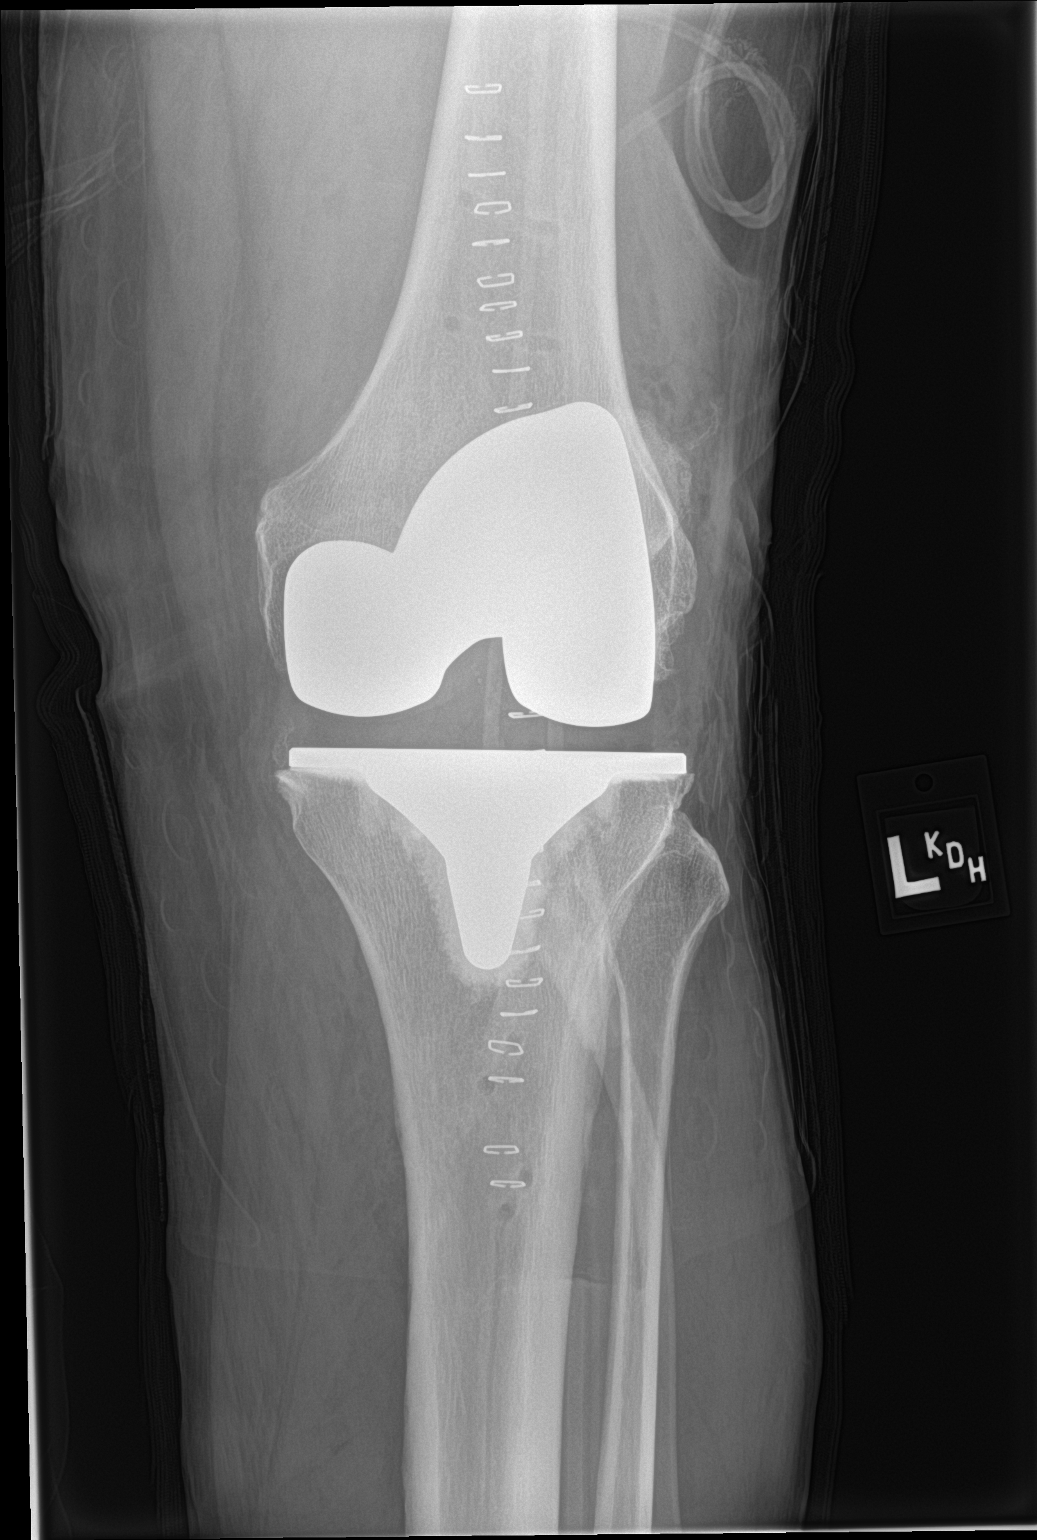

[knee lat]
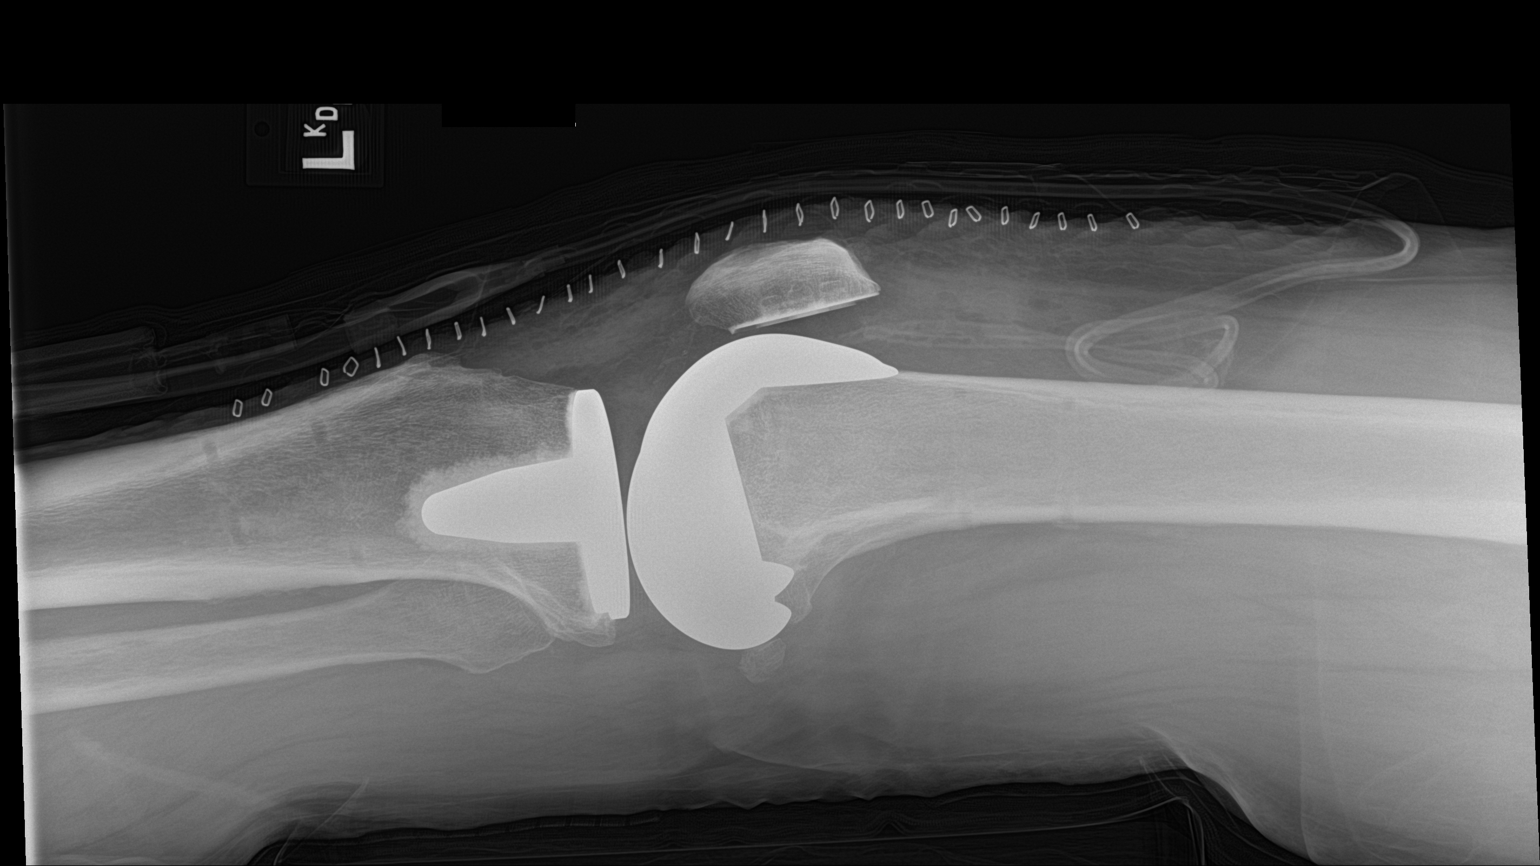

[2 of 2 positions shown; findings below may reference images not displayed]

FINDINGS: Interval postsurgical changes from left total knee arthroplasty.
Arthroplasty components appear in their expected alignment. No
periprosthetic fracture is identified. Expected postoperative
changes within the overlying soft tissues.
IMPRESSION: Expected postsurgical changes of left knee arthroplasty.

## 2022-12-14 DIAGNOSIS — L988 Other specified disorders of the skin and subcutaneous tissue: Secondary | ICD-10-CM | POA: Diagnosis not present

## 2022-12-14 DIAGNOSIS — C4491 Basal cell carcinoma of skin, unspecified: Secondary | ICD-10-CM | POA: Diagnosis not present

## 2022-12-28 DIAGNOSIS — L905 Scar conditions and fibrosis of skin: Secondary | ICD-10-CM | POA: Diagnosis not present

## 2022-12-28 DIAGNOSIS — C4491 Basal cell carcinoma of skin, unspecified: Secondary | ICD-10-CM | POA: Diagnosis not present

## 2023-01-13 ENCOUNTER — Other Ambulatory Visit: Payer: Self-pay

## 2023-01-13 DIAGNOSIS — E785 Hyperlipidemia, unspecified: Secondary | ICD-10-CM | POA: Diagnosis not present

## 2023-01-13 DIAGNOSIS — Z0000 Encounter for general adult medical examination without abnormal findings: Secondary | ICD-10-CM | POA: Diagnosis not present

## 2023-01-13 DIAGNOSIS — I6522 Occlusion and stenosis of left carotid artery: Secondary | ICD-10-CM

## 2023-01-13 DIAGNOSIS — Z96651 Presence of right artificial knee joint: Secondary | ICD-10-CM | POA: Diagnosis not present

## 2023-01-13 DIAGNOSIS — Z87891 Personal history of nicotine dependence: Secondary | ICD-10-CM

## 2023-01-25 ENCOUNTER — Ambulatory Visit
Admission: RE | Admit: 2023-01-25 | Discharge: 2023-01-25 | Disposition: A | Discharge status: Home / Self Care | Payer: PPO | Source: Ambulatory Visit | Attending: Infectious Diseases | Admitting: Infectious Diseases

## 2023-01-25 DIAGNOSIS — Z87891 Personal history of nicotine dependence: Secondary | ICD-10-CM | POA: Diagnosis not present

## 2023-01-25 DIAGNOSIS — Z96651 Presence of right artificial knee joint: Secondary | ICD-10-CM | POA: Diagnosis not present

## 2023-01-25 DIAGNOSIS — I6522 Occlusion and stenosis of left carotid artery: Secondary | ICD-10-CM | POA: Diagnosis not present

## 2023-01-25 DIAGNOSIS — E785 Hyperlipidemia, unspecified: Secondary | ICD-10-CM | POA: Diagnosis not present

## 2023-01-25 DIAGNOSIS — I714 Abdominal aortic aneurysm, without rupture, unspecified: Secondary | ICD-10-CM | POA: Diagnosis not present

## 2023-01-25 DIAGNOSIS — Z136 Encounter for screening for cardiovascular disorders: Secondary | ICD-10-CM | POA: Diagnosis not present

## 2023-04-28 ENCOUNTER — Other Ambulatory Visit (INDEPENDENT_AMBULATORY_CARE_PROVIDER_SITE_OTHER): Payer: Self-pay | Admitting: Nurse Practitioner

## 2023-04-28 DIAGNOSIS — I6523 Occlusion and stenosis of bilateral carotid arteries: Secondary | ICD-10-CM

## 2023-05-03 ENCOUNTER — Ambulatory Visit (INDEPENDENT_AMBULATORY_CARE_PROVIDER_SITE_OTHER): Payer: PPO

## 2023-05-03 ENCOUNTER — Ambulatory Visit (INDEPENDENT_AMBULATORY_CARE_PROVIDER_SITE_OTHER): Payer: PPO | Admitting: Vascular Surgery

## 2023-05-03 DIAGNOSIS — I6523 Occlusion and stenosis of bilateral carotid arteries: Secondary | ICD-10-CM | POA: Diagnosis not present

## 2023-07-19 DIAGNOSIS — M5430 Sciatica, unspecified side: Secondary | ICD-10-CM | POA: Diagnosis not present

## 2023-07-19 DIAGNOSIS — M549 Dorsalgia, unspecified: Secondary | ICD-10-CM | POA: Diagnosis not present

## 2023-10-19 DIAGNOSIS — L578 Other skin changes due to chronic exposure to nonionizing radiation: Secondary | ICD-10-CM | POA: Diagnosis not present

## 2023-10-19 DIAGNOSIS — D1801 Hemangioma of skin and subcutaneous tissue: Secondary | ICD-10-CM | POA: Diagnosis not present

## 2023-10-19 DIAGNOSIS — Z872 Personal history of diseases of the skin and subcutaneous tissue: Secondary | ICD-10-CM | POA: Diagnosis not present

## 2023-10-19 DIAGNOSIS — Z85828 Personal history of other malignant neoplasm of skin: Secondary | ICD-10-CM | POA: Diagnosis not present

## 2023-10-19 DIAGNOSIS — Q828 Other specified congenital malformations of skin: Secondary | ICD-10-CM | POA: Diagnosis not present

## 2023-10-19 DIAGNOSIS — L570 Actinic keratosis: Secondary | ICD-10-CM | POA: Diagnosis not present

## 2024-01-17 DIAGNOSIS — I6522 Occlusion and stenosis of left carotid artery: Secondary | ICD-10-CM | POA: Diagnosis not present

## 2024-01-17 DIAGNOSIS — Z0000 Encounter for general adult medical examination without abnormal findings: Secondary | ICD-10-CM | POA: Diagnosis not present

## 2024-01-17 DIAGNOSIS — Z96651 Presence of right artificial knee joint: Secondary | ICD-10-CM | POA: Diagnosis not present

## 2024-01-17 DIAGNOSIS — E785 Hyperlipidemia, unspecified: Secondary | ICD-10-CM | POA: Diagnosis not present

## 2024-03-05 DIAGNOSIS — F32A Depression, unspecified: Secondary | ICD-10-CM | POA: Diagnosis not present

## 2024-03-29 DIAGNOSIS — J00 Acute nasopharyngitis [common cold]: Secondary | ICD-10-CM | POA: Diagnosis not present

## 2024-03-29 DIAGNOSIS — Z6832 Body mass index (BMI) 32.0-32.9, adult: Secondary | ICD-10-CM | POA: Diagnosis not present

## 2024-03-29 DIAGNOSIS — R030 Elevated blood-pressure reading, without diagnosis of hypertension: Secondary | ICD-10-CM | POA: Diagnosis not present

## 2024-03-29 DIAGNOSIS — R051 Acute cough: Secondary | ICD-10-CM | POA: Diagnosis not present

## 2024-03-29 DIAGNOSIS — E669 Obesity, unspecified: Secondary | ICD-10-CM | POA: Diagnosis not present

## 2024-05-01 ENCOUNTER — Other Ambulatory Visit (INDEPENDENT_AMBULATORY_CARE_PROVIDER_SITE_OTHER): Payer: Self-pay | Admitting: Vascular Surgery

## 2024-05-01 DIAGNOSIS — I6523 Occlusion and stenosis of bilateral carotid arteries: Secondary | ICD-10-CM

## 2024-05-04 ENCOUNTER — Encounter (INDEPENDENT_AMBULATORY_CARE_PROVIDER_SITE_OTHER): Payer: PPO

## 2024-05-04 ENCOUNTER — Ambulatory Visit (INDEPENDENT_AMBULATORY_CARE_PROVIDER_SITE_OTHER): Payer: PPO | Admitting: Nurse Practitioner

## 2024-10-18 DIAGNOSIS — L570 Actinic keratosis: Secondary | ICD-10-CM | POA: Diagnosis not present

## 2024-10-18 DIAGNOSIS — L578 Other skin changes due to chronic exposure to nonionizing radiation: Secondary | ICD-10-CM | POA: Diagnosis not present

## 2024-10-18 DIAGNOSIS — Q828 Other specified congenital malformations of skin: Secondary | ICD-10-CM | POA: Diagnosis not present

## 2024-10-18 DIAGNOSIS — Z872 Personal history of diseases of the skin and subcutaneous tissue: Secondary | ICD-10-CM | POA: Diagnosis not present

## 2024-10-18 DIAGNOSIS — Z85828 Personal history of other malignant neoplasm of skin: Secondary | ICD-10-CM | POA: Diagnosis not present
